# Patient Record
Sex: Male | Born: 1955 | Race: Black or African American | Hispanic: No | Marital: Single | Smoking: Current every day smoker
Health system: Southern US, Community
[De-identification: ages and names within clinical notes are randomized; demographics above are authoritative.]

## PROBLEM LIST (undated history)

## (undated) DIAGNOSIS — G250 Essential tremor: Secondary | ICD-10-CM

## (undated) DIAGNOSIS — J449 Chronic obstructive pulmonary disease, unspecified: Secondary | ICD-10-CM

## (undated) DIAGNOSIS — J4489 Other specified chronic obstructive pulmonary disease: Secondary | ICD-10-CM

## (undated) DIAGNOSIS — I10 Essential (primary) hypertension: Secondary | ICD-10-CM

## (undated) DIAGNOSIS — E785 Hyperlipidemia, unspecified: Secondary | ICD-10-CM

## (undated) DIAGNOSIS — M109 Gout, unspecified: Secondary | ICD-10-CM

## (undated) DIAGNOSIS — M1990 Unspecified osteoarthritis, unspecified site: Secondary | ICD-10-CM

## (undated) DIAGNOSIS — R319 Hematuria, unspecified: Secondary | ICD-10-CM

## (undated) DIAGNOSIS — K7460 Unspecified cirrhosis of liver: Secondary | ICD-10-CM

## (undated) DIAGNOSIS — F1010 Alcohol abuse, uncomplicated: Secondary | ICD-10-CM

## (undated) HISTORY — DX: Unspecified osteoarthritis, unspecified site: M19.90

## (undated) HISTORY — DX: Gout, unspecified: M10.9

## (undated) HISTORY — PX: OTHER SURGICAL HISTORY: SHX169

## (undated) HISTORY — DX: Essential (primary) hypertension: I10

## (undated) HISTORY — DX: Hyperlipidemia, unspecified: E78.5

## (undated) HISTORY — PX: HERNIA REPAIR: SHX51

## (undated) HISTORY — DX: Hematuria, unspecified: R31.9

---

## 2007-01-30 ENCOUNTER — Emergency Department: Payer: Self-pay | Admitting: Unknown Physician Specialty

## 2007-08-02 ENCOUNTER — Ambulatory Visit: Payer: Self-pay | Admitting: Rheumatology

## 2009-04-09 ENCOUNTER — Emergency Department: Payer: Self-pay | Admitting: Emergency Medicine

## 2009-04-10 ENCOUNTER — Emergency Department: Payer: Self-pay | Admitting: Emergency Medicine

## 2011-08-12 ENCOUNTER — Other Ambulatory Visit: Payer: Self-pay | Admitting: Unknown Physician Specialty

## 2011-08-12 LAB — BODY FLUID CELL COUNT WITH DIFFERENTIAL
Basophil: 0 %
Eosinophil: 0 %
Lymphocytes: 3 %
Neutrophils: 86 %
Other Cells BF: 0 %

## 2011-08-12 LAB — SYNOVIAL FLUID, CRYSTAL

## 2012-04-09 ENCOUNTER — Emergency Department: Payer: Self-pay | Admitting: Emergency Medicine

## 2012-04-09 LAB — CBC
HCT: 40.5 % (ref 40.0–52.0)
MCH: 31.4 pg (ref 26.0–34.0)
MCHC: 34.6 g/dL (ref 32.0–36.0)
RDW: 14.4 % (ref 11.5–14.5)

## 2013-11-22 DIAGNOSIS — J45909 Unspecified asthma, uncomplicated: Secondary | ICD-10-CM | POA: Insufficient documentation

## 2013-11-22 DIAGNOSIS — E785 Hyperlipidemia, unspecified: Secondary | ICD-10-CM | POA: Insufficient documentation

## 2013-11-22 DIAGNOSIS — I10 Essential (primary) hypertension: Secondary | ICD-10-CM | POA: Insufficient documentation

## 2013-12-12 DIAGNOSIS — R29898 Other symptoms and signs involving the musculoskeletal system: Secondary | ICD-10-CM

## 2013-12-12 DIAGNOSIS — M259 Joint disorder, unspecified: Secondary | ICD-10-CM | POA: Insufficient documentation

## 2014-04-09 ENCOUNTER — Emergency Department: Payer: Self-pay | Admitting: Emergency Medicine

## 2014-04-15 DIAGNOSIS — S37009A Unspecified injury of unspecified kidney, initial encounter: Secondary | ICD-10-CM | POA: Insufficient documentation

## 2014-04-15 DIAGNOSIS — E876 Hypokalemia: Secondary | ICD-10-CM | POA: Insufficient documentation

## 2014-04-15 DIAGNOSIS — D696 Thrombocytopenia, unspecified: Secondary | ICD-10-CM | POA: Insufficient documentation

## 2014-04-15 DIAGNOSIS — M109 Gout, unspecified: Secondary | ICD-10-CM | POA: Insufficient documentation

## 2014-04-15 DIAGNOSIS — G939 Disorder of brain, unspecified: Secondary | ICD-10-CM | POA: Insufficient documentation

## 2014-04-15 DIAGNOSIS — G250 Essential tremor: Secondary | ICD-10-CM | POA: Insufficient documentation

## 2014-04-18 DIAGNOSIS — E512 Wernicke's encephalopathy: Secondary | ICD-10-CM | POA: Insufficient documentation

## 2014-04-18 DIAGNOSIS — F1010 Alcohol abuse, uncomplicated: Secondary | ICD-10-CM | POA: Insufficient documentation

## 2014-04-26 ENCOUNTER — Ambulatory Visit: Payer: Self-pay | Admitting: Nephrology

## 2014-04-26 DIAGNOSIS — R972 Elevated prostate specific antigen [PSA]: Secondary | ICD-10-CM | POA: Insufficient documentation

## 2015-08-26 ENCOUNTER — Ambulatory Visit: Payer: Self-pay | Admitting: Urology

## 2015-08-29 ENCOUNTER — Telehealth: Payer: Self-pay | Admitting: Urology

## 2015-08-29 ENCOUNTER — Encounter: Payer: Self-pay | Admitting: Urology

## 2015-08-29 ENCOUNTER — Ambulatory Visit (INDEPENDENT_AMBULATORY_CARE_PROVIDER_SITE_OTHER): Payer: BLUE CROSS/BLUE SHIELD | Admitting: Urology

## 2015-08-29 VITALS — BP 148/79 | HR 75 | Ht 73.0 in | Wt 211.0 lb

## 2015-08-29 DIAGNOSIS — K648 Other hemorrhoids: Secondary | ICD-10-CM

## 2015-08-29 DIAGNOSIS — Z87898 Personal history of other specified conditions: Secondary | ICD-10-CM | POA: Insufficient documentation

## 2015-08-29 DIAGNOSIS — N401 Enlarged prostate with lower urinary tract symptoms: Secondary | ICD-10-CM

## 2015-08-29 DIAGNOSIS — R310 Gross hematuria: Secondary | ICD-10-CM

## 2015-08-29 DIAGNOSIS — N138 Other obstructive and reflux uropathy: Secondary | ICD-10-CM | POA: Insufficient documentation

## 2015-08-29 DIAGNOSIS — N400 Enlarged prostate without lower urinary tract symptoms: Secondary | ICD-10-CM

## 2015-08-29 LAB — URINALYSIS, COMPLETE
Bilirubin, UA: POSITIVE — AB
Ketones, UA: NEGATIVE
Leukocytes, UA: NEGATIVE
NITRITE UA: NEGATIVE
RBC, UA: NEGATIVE
Specific Gravity, UA: 1.01 (ref 1.005–1.030)
Urobilinogen, Ur: >8 mg/dL — ABNORMAL HIGH (ref 0.2–1.0)
pH, UA: 6 (ref 5.0–7.5)

## 2015-08-29 LAB — MICROSCOPIC EXAMINATION: BACTERIA UA: NONE SEEN

## 2015-08-29 NOTE — Progress Notes (Addendum)
08/29/2015 10:51 AM   Raymond Erickson [birth year] [id number]  Referring provider: No referring provider defined for this encounter.  Chief Complaint  Patient presents with  . Hematuria     referred by Raymond Erickson    HPI: Patient is a 60 year old African American male who is referred by Raymond Erickson for two episodes of painless gross hematuria.    Gross hematuria Patient had two episodes of painless gross hematuria.  He has not experienced dysuria, flank pain or suprapubic pain.  He has spontaneously passed a stone in the distant past.  He does not have a personal history of GU malignancies. He does not have a family history of GU malignancies.  History of elevated PSA At one point, patient was having PSAs drawn at his place of employment.  He states his PSA was found to be elevated and was seen by Raymond Erickson for further evaluation.  He feels that he may have had a prostate biopsy which he believes was negative. He was given a medication to shrink the prostate and has not followed up. This was approximately 3 years ago.  I will request those records.  BPH with LUTS Patient was told he had a "big prostate" and was given finasteride for his prostate by Dr. Evelene Erickson.  He does not have any urinary complaints at this time.     PMH: Past Medical History  Diagnosis Date  . Arthritis   . Hematuria   . Gout   . HLD (hyperlipidemia)   . HTN (hypertension)     Surgical History: Past Surgical History  Procedure Laterality Date  . Hernia repair    . Broken nose      Home Medications:    Medication List       This list is accurate as of: 08/29/15 10:51 AM.  Always use your most recent med list.               allopurinol 100 MG tablet  Commonly known as:  ZYLOPRIM  Take by mouth.     amLODipine 10 MG tablet  Commonly known as:  NORVASC  Take by mouth. Reported on 08/29/2015     chlorthalidone 25 MG tablet  Commonly known as:  HYGROTON  take 1 tablet by mouth once  daily if needed     clobetasol ointment 0.05 %  Commonly known as:  TEMOVATE     clonazePAM 0.5 MG tablet  Commonly known as:  KLONOPIN     cloNIDine 0.1 MG tablet  Commonly known as:  CATAPRES  Take by mouth.     colchicine 0.6 MG tablet  Take by mouth.     COMBIVENT RESPIMAT 20-100 MCG/ACT Aers respimat  Generic drug:  Ipratropium-Albuterol  inhale 2 puffs by mouth three times a day if needed     cyclobenzaprine 5 MG tablet  Commonly known as:  FLEXERIL  Take 5 mg by mouth. Reported on 08/29/2015     hydrALAZINE 50 MG tablet  Commonly known as:  APRESOLINE  Take by mouth.     hydrOXYzine 25 MG tablet  Commonly known as:  ATARAX/VISTARIL  Take by mouth. Reported on 08/29/2015     propranolol 40 MG tablet  Commonly known as:  INDERAL     triamcinolone lotion 0.1 %  Commonly known as:  KENALOG     Vitamin D (Ergocalciferol) 50000 units Caps capsule  Commonly known as:  DRISDOL        Allergies:  Allergies  Allergen Reactions  . Ace Inhibitors Swelling  . Lisinopril Swelling    Family History: Family History  Problem Relation Age of Onset  . Kidney disease Neg Hx   . Prostate cancer Neg Hx     Social History:  reports that he has been smoking.  He does not have any smokeless tobacco history on file. He reports that he drinks alcohol. He reports that he does not use illicit drugs.  ROS: UROLOGY Frequent Urination?: No Hard to postpone urination?: No Burning/pain with urination?: No Get up at night to urinate?: No Leakage of urine?: No Urine stream starts and stops?: No Trouble starting stream?: No Do you have to strain to urinate?: No Blood in urine?: Yes Urinary tract infection?: No Sexually transmitted disease?: No Injury to kidneys or bladder?: No Painful intercourse?: No Weak stream?: No Erection problems?: No Penile pain?: No  Gastrointestinal Nausea?: Yes Vomiting?: Yes Indigestion/heartburn?: No Diarrhea?: Yes Constipation?:  Yes  Constitutional Fever: No Night sweats?: No Weight loss?: No Fatigue?: No  Skin Skin rash/lesions?: Yes Itching?: Yes  Eyes Blurred vision?: No Double vision?: No  Ears/Nose/Throat Sore throat?: No Sinus problems?: Yes  Hematologic/Lymphatic Swollen glands?: No Easy bruising?: No  Cardiovascular Leg swelling?: Yes Chest pain?: No  Respiratory Cough?: Yes Shortness of breath?: No  Endocrine Excessive thirst?: No  Musculoskeletal Back pain?: No Joint pain?: Yes  Neurological Headaches?: No Dizziness?: No  Psychologic Depression?: No Anxiety?: No  Physical Exam: BP 148/79 mmHg  Pulse 75  Ht  (1.854 m)  Wt 211 lb (95.709 kg)  BMI 27.84 kg/m2  Constitutional: Well nourished. Alert and oriented, No acute distress. HEENT: [place] AT, moist mucus membranes. Trachea midline, no masses. Cardiovascular: No clubbing, cyanosis, or edema. Respiratory: Normal respiratory effort, no increased work of breathing. GI: Abdomen is soft, non tender, non distended, no abdominal masses. Liver and spleen not palpable.  No hernias appreciated.  Stool sample for occult testing is not indicated.   GU: No CVA tenderness.  No bladder fullness or masses.  Patient with uncircumcised phallus. Foreskin easily retracted  Urethral meatus is patent.  No penile discharge. No penile lesions or rashes. Scrotum without lesions, cysts, rashes and/or edema.  Testicles are located scrotally bilaterally. No masses are appreciated in the testicles. Left and right epididymis are normal. Rectal: Patient with  normal sphincter tone. Anus and perineum without scarring or rashes. External hemrrhoids.  Blood on glove.  No rectal masses are appreciated. Prostate is approximately 50 grams, no nodules are appreciated. Seminal vesicles are normal. Skin: No rashes, bruises or suspicious lesions. Lymph: No cervical or inguinal adenopathy. Neurologic: Grossly intact, no focal deficits, moving all 4  extremities. Psychiatric: Normal mood and affect.  Laboratory Data:  Lab Results  Component Value Date   WBC 5.1 04/09/2012   HGB 14.0 04/09/2012   HCT 40.5 04/09/2012   MCV 91 04/09/2012   PLT 188 04/09/2012    Urinalysis Results for orders placed or performed in visit on 08/29/15  Microscopic Examination  Result Value Ref Range   WBC, UA 0-5 0 -  5 /hpf   RBC, UA 0-2 0 -  2 /hpf   Epithelial Cells (non renal) 0-10 0 - 10 /hpf   Bacteria, UA None seen None seen/Few  Urinalysis, Complete  Result Value Ref Range   Specific Gravity, UA 1.010 1.005 - 1.030   pH, UA 6.0 5.0 - 7.5   Color, UA Yellow Yellow   Appearance Ur Clear Clear  Leukocytes, UA Negative Negative   Protein, UA 1+ (A) Negative/Trace   Glucose, UA Trace (A) Negative   Ketones, UA Negative Negative   RBC, UA Negative Negative   Bilirubin, UA Positive (A) Negative   Urobilinogen, Ur >8.0 (H) 0.2 - 1.0 mg/dL   Nitrite, UA Negative Negative   Microscopic Examination See below:    Assessment & Plan:    1. Gross hematuria:    Explained to patient the causes of blood in the urine are as follows: stones,  BPH, UTI's, damage to the urinary tract and/or cancer.  It is explained to the patient that they will be scheduled for a CT Urogram with contrast material and that in rare instances, an allergic reaction can be serious and even life threatening with the injection of contrast material.   The patient denies any allergies to contrast, iodine and/or seafood and is not taking metformin.  I have explained to the patient that they will  be scheduled for a cystoscopy in our office to evaluate their bladder.  The cystoscopy consists of passing a tube with a lens up through their urethra and into their urinary bladder.   We will inject the urethra with a lidocaine gel prior to introducing the cystoscope to help with any discomfort during the procedure.   After the procedure, they might experience blood in the urine and  discomfort with urination.  This will abate after the first few voids.  I have  encouraged the patient to increase water intake  during this time.  Patient denies any allergies to lidocaine.   - Urinalysis, Complete - CULTURE, URINE COMPREHENSIVE - BUN+Creat  2. History of elevated PSA:   Patient reports a history of elevated PSA's and a previous biopsy with Dr. Evelene Croon.  I will obtain a PSA today and request his records from Dr. Amada Jupiter office.    3. BPH without obstruction/lower urinary tract symptoms:    Patient has been on finasteride in the past.  His prostate is enlarged on exam.  We will continue to monitor as the prostate as it will continue to grow and may cause worsening of LUTS.  If no GU pathology is discovered during his hematuria work up and his PSA is not elevated, we will follow the patient with an IPSS score, exam and PSA on a yearly basis.  - PSA  4. Bleeding hemorrhoids:   Patient has not had a screening colonoscopy.  I recommended to the patient to discuss this with his PCP.     Return for CT Urogram report and cystoscopy.  These notes generated with voice recognition software. I apologize for typographical errors.  Cloretta Ned  [facility] [address], [address] [place], [place] [address] [phone]  Addendum:   PSA history obtained from Dr. Terrance Mass office    3.1 in 2008    3.9 in 2009    3.3 in 2010    5.6 in 2011    Biopsy negative in 2012 with iPSA 5.6

## 2015-08-29 NOTE — Telephone Encounter (Signed)
Would you send a copy of my note today to Dr. Thedore MinsSingh?

## 2015-08-30 LAB — BUN+CREAT
BUN/Creatinine Ratio: 9 (ref 9–20)
BUN: 5 mg/dL — ABNORMAL LOW (ref 6–24)
CREATININE: 0.55 mg/dL — AB (ref 0.76–1.27)
GFR calc Af Amer: 134 mL/min/{1.73_m2} (ref >59)
GFR calc non Af Amer: 116 mL/min/{1.73_m2} (ref >59)

## 2015-08-30 LAB — PSA: PROSTATE SPECIFIC AG, SERUM: 6.7 ng/mL — AB (ref 0.0–4.0)

## 2015-08-31 LAB — CULTURE, URINE COMPREHENSIVE

## 2015-09-02 NOTE — Telephone Encounter (Signed)
Done ° ° °Raymond Erickson °

## 2015-09-12 ENCOUNTER — Ambulatory Visit
Admission: RE | Admit: 2015-09-12 | Discharge: 2015-09-12 | Disposition: A | Discharge status: Home / Self Care | Payer: BLUE CROSS/BLUE SHIELD | Source: Ambulatory Visit | Attending: Urology | Admitting: Urology

## 2015-09-12 DIAGNOSIS — R310 Gross hematuria: Secondary | ICD-10-CM | POA: Diagnosis not present

## 2015-09-12 DIAGNOSIS — K8020 Calculus of gallbladder without cholecystitis without obstruction: Secondary | ICD-10-CM | POA: Insufficient documentation

## 2015-09-12 DIAGNOSIS — I700 Atherosclerosis of aorta: Secondary | ICD-10-CM | POA: Diagnosis not present

## 2015-09-12 MED ORDER — IOPAMIDOL (ISOVUE-370) INJECTION 76%
125.0000 mL | Freq: Once | INTRAVENOUS | Status: AC | PRN
  Administered 2015-09-12: 125 mL via INTRAVENOUS

## 2015-09-16 ENCOUNTER — Telehealth: Payer: Self-pay

## 2015-09-16 DIAGNOSIS — R972 Elevated prostate specific antigen [PSA]: Secondary | ICD-10-CM

## 2015-09-16 NOTE — Telephone Encounter (Signed)
-----   Message from Harle BattiestShannon A McGowan, PA-C sent at 09/12/2015  1:09 PM EDT ----- Patient had a biopsy in 2012 with Dr. Sheppard PentonWolf when his PSA was 5.6. It was benign at that time.  Patient will be coming in for a CT urogram and cystoscopy, we can repeat the PSA at that time.

## 2015-09-16 NOTE — Telephone Encounter (Signed)
Lab orders placed.  

## 2015-09-17 ENCOUNTER — Ambulatory Visit (INDEPENDENT_AMBULATORY_CARE_PROVIDER_SITE_OTHER): Payer: BLUE CROSS/BLUE SHIELD | Admitting: Urology

## 2015-09-17 VITALS — BP 136/74 | HR 111 | Ht 71.0 in | Wt 215.0 lb

## 2015-09-17 DIAGNOSIS — R972 Elevated prostate specific antigen [PSA]: Secondary | ICD-10-CM

## 2015-09-17 DIAGNOSIS — K7460 Unspecified cirrhosis of liver: Secondary | ICD-10-CM

## 2015-09-17 DIAGNOSIS — R188 Other ascites: Secondary | ICD-10-CM

## 2015-09-17 DIAGNOSIS — R310 Gross hematuria: Secondary | ICD-10-CM | POA: Diagnosis not present

## 2015-09-17 MED ORDER — CIPROFLOXACIN HCL 500 MG PO TABS
500.0000 mg | ORAL_TABLET | Freq: Once | ORAL | Status: AC
Start: 2015-09-17 — End: 2015-09-17
  Administered 2015-09-17: 500 mg via ORAL

## 2015-09-17 MED ORDER — LIDOCAINE HCL 2 % EX GEL
1.0000 "application " | Freq: Once | CUTANEOUS | Status: AC
  Administered 2015-09-17: 1 via URETHRAL

## 2015-09-17 NOTE — Progress Notes (Signed)
09/17/2015   HPI: 60 yo smoker with painless gross hematuria x 2.  He presents for office cystoscopy today to complete his workup. CT urogram shows no GU pathology which was reviewed today with the patient. He does have incidental perihepatic fluid and UA today does show 3+ bilirubin.  He also has a history of elevated PSA, most recent PSA 6.7 on 08/29/2015. He is status post prostate biopsy in 2012 at which time his PSA was 5.6 by Dr. Artis FlockWolfe.  Exam Blood pressure 136/74, pulse 111, height 5\' 11"  (1.803 m), weight 215 lb (97.523 kg). NAD.  A&O Abd soft, ND, NT Normal circumcised phallus with orthotopic meatus. Urethra patent. Scrotum unremarkable.     Study Result     CLINICAL DATA: Two episodes of painless gross hematuria in last 3 weeks. He has not experienced dysuria, flank pain or suprapubic pain. He states he has been experiencing frequent urination. Hx Kidney stones which he states he passed 5 years ago.  EXAM: CT ABDOMEN AND PELVIS WITHOUT AND WITH CONTRAST  TECHNIQUE: Multidetector CT imaging of the abdomen and pelvis was performed following the standard protocol before and following the bolus administration of intravenous contrast.  CONTRAST: 125 mL Isovue  COMPARISON: CT 01/31/2007  FINDINGS: Lower chest: Lung bases are clear.  Hepatobiliary: The liver is enlarged with prominence of the caudate lobe. No enhancing hepatic lesion. There is sludge and gallstones within the lumen of the gallbladder. Small amount of pericholecystic fluid. Mild haziness within the porta hepatis and small amount of fluid along the RIGHT hepatic lobe and pericolic gutter.  Pancreas: Pancreas is normal. No ductal dilatation. No pancreatic inflammation.  Spleen: Normal spleen  Adrenals/urinary tract: Adrenal glands are normal. No nephrolithiasis or ureterolithiasis. Cortical phase imaging demonstrates no enhancing renal cortical lesion. Delayed pyelogram phase imaging  demonstrates no filling defects within the collecting systems or ureters.  No bladder calculi. No filling defect within the bladder.  Stomach/Bowel: Large hiatal hernia. Duodenum small bowel appendix are normal. The colon rectosigmoid colon are normal.  Vascular/Lymphatic: Abdominal aorta is normal caliber with atherosclerotic calcification. There is no retroperitoneal or periportal lymphadenopathy. No pelvic lymphadenopathy.  Reproductive: Post hysterectomy.  Other: No free fluid.  Musculoskeletal: No aggressive osseous lesion.  IMPRESSION: 1. No explanation for hematuria. No nephrolithiasis, ureterolithiasis, enhancing renal cortical lesion, or filling defects within the collecting systems. 2. No bladder stones or filling defects in the bladder which does not excluded a bladder lesion. 3. Enlarged liver with perihepatic fluid suggests cirrhosis and mild ascites. 4. Cholelithiasis without clear evidence cholecystitis. 5. Atherosclerotic calcification of the abdominal aorta.   Electronically Signed  By: Genevive BiStewart Edmunds M.D.  On: 09/12/2015 10:56     Cystoscopy Procedure Note  Patient identification was confirmed, informed consent was obtained, and patient was prepped using Betadine solution.  Lidocaine jelly was administered per urethral meatus.    Preoperative abx where received prior to procedure.     Pre-Procedure: - Inspection reveals a normal caliber ureteral meatus.  Procedure: The flexible cystoscope was introduced without difficulty - No urethral strictures/lesions are present. - Normal prostate, 3 cm with minimal coapation - Normal bladder neck - Bilateral ureteral orifices identified - Bladder mucosa  reveals no ulcers, tumors, or lesions - No bladder stones - No trabeculation  Retroflexion fairly unremarkable.    Post-Procedure: - Patient tolerated the procedure well  Assessment/Plan:  1. Gross hematuria Negative CT urogram and  office cystoscopy today. No clear etiology of gross hematuria. The importance of smoking  cessation and its relationship to bladder cancer. I would recommend repeat evaluation in 2 years if he continues to smoke including renal ultrasound and office cystoscopy if he continues to have microscopic hematuria.   - Urinalysis, Complete - Comprehensive metabolic panel - ciprofloxacin (CIPRO) tablet 500 mg; Take 1 tablet (500 mg total) by mouth once. - lidocaine (XYLOCAINE) 2 % jelly 1 application; Place 1 application into the urethra once. - PSA  2. Elevated PSA History of elevated PSA status post negative biopsy in 2012. PSA repeated today, although following cystoscopy so may not be accurate.  3. Cirrhosis of liver with ascites, unspecified hepatic cirrhosis type (HCC) Perihepatic fat on CT scan with some mild ascites. 2+ bilirubin today on UA. As such, we'll check CMP. Will arrange appropriate referral as needed.

## 2015-09-18 LAB — MICROSCOPIC EXAMINATION: BACTERIA UA: NONE SEEN

## 2015-09-18 LAB — COMPREHENSIVE METABOLIC PANEL
A/G RATIO: 0.6 — AB (ref 1.2–2.2)
ALBUMIN: 3 g/dL — AB (ref 3.5–5.5)
ALK PHOS: 257 IU/L — AB (ref 39–117)
ALT: 21 IU/L (ref 0–44)
AST: 169 IU/L — ABNORMAL HIGH (ref 0–40)
BUN / CREAT RATIO: 5 — AB (ref 9–20)
BUN: 3 mg/dL — AB (ref 6–24)
Bilirubin Total: 4.1 mg/dL — ABNORMAL HIGH (ref 0.0–1.2)
CO2: 26 mmol/L (ref 18–29)
CREATININE: 0.64 mg/dL — AB (ref 0.76–1.27)
Calcium: 8.2 mg/dL — ABNORMAL LOW (ref 8.7–10.2)
Chloride: 90 mmol/L — ABNORMAL LOW (ref 96–106)
GFR, EST AFRICAN AMERICAN: 126 mL/min/{1.73_m2} (ref >59)
GFR, EST NON AFRICAN AMERICAN: 109 mL/min/{1.73_m2} (ref >59)
GLOBULIN, TOTAL: 4.7 g/dL — AB (ref 1.5–4.5)
GLUCOSE: 124 mg/dL — AB (ref 65–99)
Potassium: 2.7 mmol/L — ABNORMAL LOW (ref 3.5–5.2)
Sodium: 138 mmol/L (ref 134–144)
TOTAL PROTEIN: 7.7 g/dL (ref 6.0–8.5)

## 2015-09-18 LAB — PSA: PROSTATE SPECIFIC AG, SERUM: 6.5 ng/mL — AB (ref 0.0–4.0)

## 2015-09-18 LAB — URINALYSIS, COMPLETE
Bilirubin, UA: POSITIVE — AB
LEUKOCYTES UA: NEGATIVE
Nitrite, UA: NEGATIVE
RBC, UA: NEGATIVE
Specific Gravity, UA: 1.02 (ref 1.005–1.030)
pH, UA: 6 (ref 5.0–7.5)

## 2015-09-19 ENCOUNTER — Telehealth: Payer: Self-pay

## 2015-09-19 NOTE — Telephone Encounter (Signed)
-----   Message from Vanna ScotlandAshley Brandon, MD sent at 09/18/2015  6:05 PM EDT ----- Called patient to discuss lab results. Patient continues to drink alcohol on a regular basis and has evidence of liver dysfunction. I've message to his primary care physician as well regarding these labs and advised the patient to call today to make a follow-up with them to discuss.  Patient was also made aware of his elevated PSA. In light other medical issues at this time, I would like him to return in 3 months for repeat PSA/DRE. If stable, may continue to follow given his history of previous negative biopsies.  Please arrange f/u in 3 months for PSA/ DRE with [facility] HerterShannon.

## 2015-09-23 NOTE — Telephone Encounter (Signed)
LMOM. Need to arrange f/[facility] in 3 months [facility] Dr Apolinar JunesBrandon.

## 2015-09-23 NOTE — Telephone Encounter (Signed)
appts made per Dr Delana MeyerBrandon's note

## 2015-11-04 ENCOUNTER — Emergency Department: Payer: BLUE CROSS/BLUE SHIELD

## 2015-11-04 ENCOUNTER — Emergency Department
Admission: EM | Admit: 2015-11-04 | Discharge: 2015-11-05 | Disposition: A | Discharge status: Other Acute Inpatient Hospital | Payer: BLUE CROSS/BLUE SHIELD | Attending: Emergency Medicine | Admitting: Emergency Medicine

## 2015-11-04 ENCOUNTER — Encounter: Payer: Self-pay | Admitting: Emergency Medicine

## 2015-11-04 DIAGNOSIS — D649 Anemia, unspecified: Secondary | ICD-10-CM | POA: Diagnosis not present

## 2015-11-04 DIAGNOSIS — M1990 Unspecified osteoarthritis, unspecified site: Secondary | ICD-10-CM | POA: Insufficient documentation

## 2015-11-04 DIAGNOSIS — Z79899 Other long term (current) drug therapy: Secondary | ICD-10-CM | POA: Insufficient documentation

## 2015-11-04 DIAGNOSIS — I10 Essential (primary) hypertension: Secondary | ICD-10-CM | POA: Insufficient documentation

## 2015-11-04 DIAGNOSIS — J449 Chronic obstructive pulmonary disease, unspecified: Secondary | ICD-10-CM | POA: Diagnosis not present

## 2015-11-04 DIAGNOSIS — I959 Hypotension, unspecified: Secondary | ICD-10-CM | POA: Insufficient documentation

## 2015-11-04 DIAGNOSIS — F1010 Alcohol abuse, uncomplicated: Secondary | ICD-10-CM

## 2015-11-04 DIAGNOSIS — F1019 Alcohol abuse with unspecified alcohol-induced disorder: Secondary | ICD-10-CM | POA: Diagnosis not present

## 2015-11-04 DIAGNOSIS — E785 Hyperlipidemia, unspecified: Secondary | ICD-10-CM | POA: Insufficient documentation

## 2015-11-04 DIAGNOSIS — F17200 Nicotine dependence, unspecified, uncomplicated: Secondary | ICD-10-CM | POA: Diagnosis not present

## 2015-11-04 DIAGNOSIS — J45909 Unspecified asthma, uncomplicated: Secondary | ICD-10-CM | POA: Insufficient documentation

## 2015-11-04 DIAGNOSIS — R58 Hemorrhage, not elsewhere classified: Secondary | ICD-10-CM | POA: Insufficient documentation

## 2015-11-04 DIAGNOSIS — R1030 Lower abdominal pain, unspecified: Secondary | ICD-10-CM | POA: Diagnosis present

## 2015-11-04 MED ORDER — ONDANSETRON HCL 4 MG/2ML IJ SOLN
4.0000 mg | Freq: Once | INTRAMUSCULAR | Status: AC
  Administered 2015-11-05: 4 mg via INTRAVENOUS
  Filled 2015-11-04: qty 2

## 2015-11-04 MED ORDER — MORPHINE SULFATE (PF) 2 MG/ML IV SOLN
2.0000 mg | Freq: Once | INTRAVENOUS | Status: AC
  Administered 2015-11-05: 2 mg via INTRAVENOUS
  Filled 2015-11-04: qty 1

## 2015-11-04 MED ORDER — DIATRIZOATE MEGLUMINE & SODIUM 66-10 % PO SOLN
15.0000 mL | Freq: Once | ORAL | Status: AC
  Administered 2015-11-05: 15 mL via ORAL

## 2015-11-04 MED ORDER — PANTOPRAZOLE SODIUM 40 MG IV SOLR
40.0000 mg | Freq: Once | INTRAVENOUS | Status: AC
  Administered 2015-11-05: 40 mg via INTRAVENOUS
  Filled 2015-11-04: qty 40

## 2015-11-04 MED ORDER — LORAZEPAM 2 MG/ML IJ SOLN
0.0000 mg | Freq: Four times a day (QID) | INTRAMUSCULAR | Status: DC
Start: 2015-11-05 — End: 2015-11-05
  Administered 2015-11-05: 2 mg via INTRAVENOUS
  Filled 2015-11-04: qty 2

## 2015-11-04 MED ORDER — SODIUM CHLORIDE 0.9 % IV BOLUS (SEPSIS)
1000.0000 mL | Freq: Once | INTRAVENOUS | Status: AC
  Administered 2015-11-05: 1000 mL via INTRAVENOUS

## 2015-11-04 MED ORDER — VITAMIN B-1 100 MG PO TABS: 100.0000 mg | ORAL_TABLET | Freq: Every day | ORAL | Status: DC

## 2015-11-04 NOTE — ED Notes (Signed)
Pt arrived by EMS from home with c/o lower abdominal pain. EMS reports pt has had pain and weakness for 12 hours, hx of alcohol abuse. Pt denies bloody stool or emesis. Pt is jaundiced upon arrival, states he knows something is wrong with his liver but does not know what. Pts hypotensive upon arrival, 95/66.

## 2015-11-04 NOTE — ED Provider Notes (Signed)
[facility] Emergency Department Provider Note   ____________________________________________  Time seen: Approximately 11:43 PM  I have reviewed the triage vital signs and the nursing notes.   HISTORY  Chief Complaint Abdominal Pain    HPI Raymond Erickson is a 60 y.o. male who presents to the ED from home via EMS with chief complaint of lower abdominal pain. Patient reports onset of midline low abdominal pain to epigastrium approximately 12 hours ago. Describes pain as "gas pain". Symptoms associated with nausea. Denies associated fever, chills, chest pain, shortness of breath, vomiting, diarrhea,  bloody stools or hematemesis.Patient does admit to heavy but not daily drinking. States last drink yesterday. No history of DTs. Patient arrives to the ED tachycardic and hypotensive. Denies recent travel or trauma. Nothing makes his symptoms better or worse.   Past Medical History  Diagnosis Date  . Arthritis   . Hematuria   . Gout   . HLD (hyperlipidemia)   . HTN (hypertension)     Patient Active Problem List   Diagnosis Date Noted  . Gross hematuria 08/29/2015  . 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 History of elevated PSA 08/29/2015  . Other hemorrhoids 08/29/2015  . Elevated prostate specific antigen (PSA) 04/26/2014  . AA (alcohol abuse) 04/18/2014  . 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 Injury of kidney 04/15/2014  . Brain disorder 04/15/2014  . 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 Gout 04/15/2014  . Decreased potassium in the blood 04/15/2014  . Hypomagnesemia 04/15/2014  . Thrombocytopenia (HCC) 04/15/2014  . Disorder of hip region 12/12/2013  . Airway hyperreactivity 11/22/2013  . BP (high blood pressure) 11/22/2013  . HLD (hyperlipidemia) 11/22/2013    Past Surgical History  Procedure Laterality Date  . Hernia repair    . Broken nose      Current Outpatient Rx  Name  Route  Sig  Dispense   Refill  . allopurinol (ZYLOPRIM) 100 MG tablet   Oral   Take 100 mg by mouth 2 (two) times daily.          . chlorthalidone (HYGROTON) 25 MG tablet      take 1 tablet by mouth once daily if needed      1   . cholecalciferol (VITAMIN D) 1000 units tablet   Oral   Take 2,000 Units by mouth daily.         . clonazePAM (KLONOPIN) 0.5 MG tablet   Oral   Take 0.5 mg by mouth 2 (two) times daily as needed.          . cloNIDine (CATAPRES) 0.1 MG tablet   Oral   Take 0.1 mg by mouth 3 (three) times daily.          . COMBIVENT RESPIMAT 20-100 MCG/ACT AERS respimat      inhale 2 puffs by mouth three times a day if needed      0     Dispense as written.   . hydrALAZINE (APRESOLINE) 50 MG tablet   Oral   Take 50 mg by mouth 3 (three) times daily.          . potassium chloride SA (K-DUR,KLOR-CON) 20 MEQ tablet   Oral   Take 20 mEq by mouth daily.         . propranolol (INDERAL) 40 MG tablet   Oral   Take 40 mg by mouth 2 (two) times daily.          . Vitamin D, Ergocalciferol, (  DRISDOL) 50000 units CAPS capsule                 Allergies Ace inhibitors and Lisinopril  Family History  Problem Relation Age of Onset  . Kidney disease Neg Hx   . Prostate cancer Neg Hx     Social History Social History  Substance Use Topics  . Smoking status: Current Every Day Smoker  . Smokeless tobacco: None  . Alcohol Use: 0.0 oz/week    0 Standard drinks or equivalent per week    Review of Systems  Constitutional: No fever/chills. Eyes: No visual changes. ENT: No sore throat. Cardiovascular: Denies chest pain. Respiratory: Denies shortness of breath. Gastrointestinal: Positive for abdominal pain.  Positive for nausea, no vomiting.  No diarrhea.  No constipation. Genitourinary: Negative for dysuria. Musculoskeletal: Negative for back pain. Skin: Negative for rash. Neurological: Negative for headaches, focal weakness or numbness.  10-point ROS otherwise  negative.  ____________________________________________   PHYSICAL EXAM:  VITAL SIGNS: ED Triage Vitals  Enc Vitals Group     BP 11/04/15 2339 95/66 mmHg     Pulse Rate 11/04/15 2339 118     Resp 11/04/15 2339 17     Temp 11/04/15 2339 97.4 F (36.3 C)     Temp Source 11/04/15 2339 Oral     SpO2 11/04/15 2335 99 %     Weight 11/04/15 2339 205 lb (92.987 kg)     Height 11/04/15 2339 6\' 1"  (1.854 m)     Head Cir --      Peak Flow --      Pain Score --      Pain Loc --      Pain Edu? --      Excl. in GC? --     Constitutional: Alert and oriented. Well appearing and in mild acute distress. Eyes: Scleral icterus. PERRL. EOMI. Head: Atraumatic. Nose: No congestion/rhinnorhea. Mouth/Throat: Mucous membranes are moist.  Oropharynx non-erythematous. Neck: No stridor.   Cardiovascular: Tachycardic rate, regular rhythm. Grossly normal heart sounds.  Good peripheral circulation. Respiratory: Normal respiratory effort.  No retractions. Lungs CTAB. Gastrointestinal: Soft and mildly tender to palpation midline epigastrium, umbilicus and suprapubic regions without rebound or guarding. No distention. No abdominal bruits. No CVA tenderness. Musculoskeletal: No lower extremity tenderness nor edema.  No joint effusions. Neurologic:  Normal speech and language. No gross focal neurologic deficits are appreciated.  Skin:  Skin is warm, dry and intact. No rash noted. Psychiatric: Mood and affect are normal. Speech and behavior are normal.  ____________________________________________   LABS (all labs ordered are listed, but only abnormal results are displayed)  Labs Reviewed  CBC WITH DIFFERENTIAL/PLATELET - Abnormal; Notable for the following:    WBC 14.7 (*)    RBC 2.01 (*)    Hemoglobin 6.4 (*)    HCT 19.4 (*)    RDW 14.9 (*)    Platelets 148 (*)    Neutro Abs 12.1 (*)    Monocytes Absolute 1.1 (*)    All other components within normal limits  COMPREHENSIVE METABOLIC PANEL -  Abnormal; Notable for the following:    Potassium 3.0 (*)    CO2 15 (*)    Glucose, Bld 137 (*)    Calcium 7.1 (*)    Total Protein 6.0 (*)    Albumin 1.9 (*)    AST 110 (*)    ALT 16 (*)    Alkaline Phosphatase 176 (*)    Total Bilirubin 3.4 (*)    All  other components within normal limits  ETHANOL - Abnormal; Notable for the following:    Alcohol, Ethyl (B) 18 (*)    All other components within normal limits  LIPASE, BLOOD - Abnormal; Notable for the following:    Lipase 54 (*)    All other components within normal limits  PROTIME-INR - Abnormal; Notable for the following:    Prothrombin Time 16.7 (*)    All other components within normal limits  TROPONIN I  AMMONIA  TYPE AND SCREEN  ABO/RH  PREPARE RBC (CROSSMATCH)   ____________________________________________  EKG  ED ECG REPORT I, SUNG,JADE J, the attending physician, personally viewed and interpreted this ECG.   Date: 11/05/2015  EKG Time: 2342  Rate: 119  Rhythm: sinus tachycardia  Axis: Normal  Intervals:none  ST&T Change: Nonspecific  ____________________________________________  RADIOLOGY  CT abdomen/pelvis discussed with Dr. Manus GunningEhinger: 1. Large volume hemoperitoneum, most prominent in the right abdomen. Suspected source is a prominent collateral vessel coursing in the pericolic gutter, which appears to be is enteric in origin. Alternatively, i.e. bleeding hepatic lesion is considered, however felt less likely as no focal lesion was seen on CT performed last month. Patient does have underlying cirrhosis. 2. Additional chronic findings as described. Critical Value/emergent results were called by telephone at the time of interpretation on 11/05/2015 at 1:28 am to Dr. Chiquita LothJADE SUNG , who verbally acknowledged these results. ____________________________________________   PROCEDURES  Procedure(s) performed:   Rectal exam: External exam WNL. Tan stool on gloved finger which is slowly heme +.  Critical  Care performed:   CRITICAL CARE Performed by: Irean HongSUNG,JADE J   Total critical care time: 60 minutes  Critical care time was exclusive of separately billable procedures and treating other patients.  Critical care was necessary to treat or prevent imminent or life-threatening deterioration.  Critical care was time spent personally by me on the following activities: development of treatment plan with patient and/or surrogate as well as nursing, discussions with consultants, evaluation of patient's response to treatment, examination of patient, obtaining history from patient or surrogate, ordering and performing treatments and interventions, ordering and review of laboratory studies, ordering and review of radiographic studies, pulse oximetry and re-evaluation of patient's condition.  ____________________________________________   INITIAL IMPRESSION / ASSESSMENT AND PLAN / ED COURSE  Pertinent labs & imaging results that were available during my care of the patient were reviewed by me and considered in my medical decision making (see chart for details).  60 year old male with alcohol dependency with midline abdominal pain who arrives tachycardic and hypotensive. Repeat blood pressure increased to 95/66. Will obtain screening lab work, type and screen, CT abdomen/pelvis to evaluate for intra-abdominal/vascular etiologies for patient's pain. Continue IV fluid resuscitation. Will place on CIWA protocol.  ----------------------------------------- 1:32 AM on 11/05/2015 -----------------------------------------  Discussed CT scan with the radiologist. Vascular surgery paged.  ----------------------------------------- 1:44 AM on 11/05/2015 -----------------------------------------  Discussed with Dr. Myra GianottiBrabham (vascular surgery on call) who reviewed CT images. Recommends transfer to call for  interventional radiology for possible embolization which is not available overnight at this facility. In  the meantime I have ordered 4 units of PRBCs and will start transfusion once the blood is ready. I have updated the patient and his family members who are all agreeable with plan of care. [facility] contacted for transfer.  ----------------------------------------- 2:10 AM on 11/05/2015 -----------------------------------------  [facility] Blood is infusing; BP 111/74. Patient and family updated. [facility] to transport once patient receives bed assignment.  -----------------------------------------  4:01 AM on 11/05/2015 -----------------------------------------  [facility] at bedside for transport. BP 108/63, PRBCs infusing. ____________________________________________   FINAL CLINICAL IMPRESSION(S) / ED DIAGNOSES  Final diagnoses:  Intra abdominal hemorrhage  Hypotension, unspecified hypotension type  Anemia requiring transfusions  AA (alcohol abuse)      NEW MEDICATIONS STARTED DURING THIS VISIT:  New Prescriptions   No medications on file     Note:  This document was prepared using Dragon voice recognition software and may include unintentional dictation errors.    Irean Hong, MD 11/05/15 [id number]

## 2015-11-05 ENCOUNTER — Emergency Department: Payer: BLUE CROSS/BLUE SHIELD

## 2015-11-05 ENCOUNTER — Inpatient Hospital Stay (HOSPITAL_COMMUNITY)
Admission: AD | Admit: 2015-11-05 | Discharge: 2015-11-09 | DRG: 393 | Disposition: A | Discharge status: Home Health | Payer: BLUE CROSS/BLUE SHIELD | Source: Other Acute Inpatient Hospital | Attending: Internal Medicine | Admitting: Internal Medicine

## 2015-11-05 ENCOUNTER — Encounter (HOSPITAL_COMMUNITY): Payer: Self-pay | Admitting: Pulmonary Disease

## 2015-11-05 ENCOUNTER — Encounter: Payer: Self-pay | Admitting: Radiology

## 2015-11-05 DIAGNOSIS — R58 Hemorrhage, not elsewhere classified: Secondary | ICD-10-CM | POA: Diagnosis not present

## 2015-11-05 DIAGNOSIS — R000 Tachycardia, unspecified: Secondary | ICD-10-CM | POA: Diagnosis not present

## 2015-11-05 DIAGNOSIS — R319 Hematuria, unspecified: Secondary | ICD-10-CM | POA: Diagnosis not present

## 2015-11-05 DIAGNOSIS — K661 Hemoperitoneum: Secondary | ICD-10-CM | POA: Diagnosis present

## 2015-11-05 DIAGNOSIS — F10231 Alcohol dependence with withdrawal delirium: Secondary | ICD-10-CM | POA: Diagnosis present

## 2015-11-05 DIAGNOSIS — E785 Hyperlipidemia, unspecified: Secondary | ICD-10-CM | POA: Diagnosis not present

## 2015-11-05 DIAGNOSIS — I959 Hypotension, unspecified: Secondary | ICD-10-CM | POA: Diagnosis present

## 2015-11-05 DIAGNOSIS — N170 Acute kidney failure with tubular necrosis: Secondary | ICD-10-CM | POA: Diagnosis not present

## 2015-11-05 DIAGNOSIS — M1990 Unspecified osteoarthritis, unspecified site: Secondary | ICD-10-CM | POA: Diagnosis present

## 2015-11-05 DIAGNOSIS — I509 Heart failure, unspecified: Secondary | ICD-10-CM | POA: Diagnosis not present

## 2015-11-05 DIAGNOSIS — F1010 Alcohol abuse, uncomplicated: Secondary | ICD-10-CM | POA: Diagnosis present

## 2015-11-05 DIAGNOSIS — F17200 Nicotine dependence, unspecified, uncomplicated: Secondary | ICD-10-CM | POA: Diagnosis not present

## 2015-11-05 DIAGNOSIS — G250 Essential tremor: Secondary | ICD-10-CM | POA: Diagnosis not present

## 2015-11-05 DIAGNOSIS — K766 Portal hypertension: Secondary | ICD-10-CM | POA: Diagnosis present

## 2015-11-05 DIAGNOSIS — I10 Essential (primary) hypertension: Secondary | ICD-10-CM | POA: Diagnosis present

## 2015-11-05 DIAGNOSIS — I9589 Other hypotension: Secondary | ICD-10-CM | POA: Diagnosis not present

## 2015-11-05 DIAGNOSIS — F10931 Alcohol use, unspecified with withdrawal delirium: Secondary | ICD-10-CM | POA: Diagnosis present

## 2015-11-05 DIAGNOSIS — M109 Gout, unspecified: Secondary | ICD-10-CM | POA: Diagnosis present

## 2015-11-05 DIAGNOSIS — J449 Chronic obstructive pulmonary disease, unspecified: Secondary | ICD-10-CM | POA: Diagnosis present

## 2015-11-05 DIAGNOSIS — Z720 Tobacco use: Secondary | ICD-10-CM | POA: Diagnosis present

## 2015-11-05 DIAGNOSIS — R109 Unspecified abdominal pain: Secondary | ICD-10-CM | POA: Diagnosis present

## 2015-11-05 DIAGNOSIS — J438 Other emphysema: Secondary | ICD-10-CM | POA: Diagnosis not present

## 2015-11-05 DIAGNOSIS — K7031 Alcoholic cirrhosis of liver with ascites: Secondary | ICD-10-CM | POA: Diagnosis present

## 2015-11-05 DIAGNOSIS — R972 Elevated prostate specific antigen [PSA]: Secondary | ICD-10-CM | POA: Diagnosis present

## 2015-11-05 DIAGNOSIS — N179 Acute kidney failure, unspecified: Secondary | ICD-10-CM | POA: Diagnosis present

## 2015-11-05 DIAGNOSIS — K7030 Alcoholic cirrhosis of liver without ascites: Secondary | ICD-10-CM | POA: Diagnosis present

## 2015-11-05 HISTORY — DX: Essential tremor: G25.0

## 2015-11-05 HISTORY — DX: Chronic obstructive pulmonary disease, unspecified: J44.9

## 2015-11-05 HISTORY — DX: Alcohol abuse, uncomplicated: F10.10

## 2015-11-05 HISTORY — DX: Other specified chronic obstructive pulmonary disease: J44.89

## 2015-11-05 LAB — BASIC METABOLIC PANEL
Anion gap: 12 (ref 5–15)
Anion gap: 12 (ref 5–15)
BUN: 7 mg/dL (ref 6–20)
BUN: 9 mg/dL (ref 6–20)
CALCIUM: 7.8 mg/dL — AB (ref 8.9–10.3)
CHLORIDE: 102 mmol/L (ref 101–111)
CO2: 22 mmol/L (ref 22–32)
CO2: 21 mmol/L — ABNORMAL LOW (ref 22–32)
CREATININE: 1.36 mg/dL — AB (ref 0.61–1.24)
CREATININE: 1.27 mg/dL — AB (ref 0.61–1.24)
Calcium: 7.7 mg/dL — ABNORMAL LOW (ref 8.9–10.3)
Chloride: 102 mmol/L (ref 101–111)
GFR calc Af Amer: >60 mL/min (ref >60)
GFR calc Af Amer: >60 mL/min (ref >60)
GFR calc non Af Amer: 56 mL/min — ABNORMAL LOW (ref >60)
GLUCOSE: 142 mg/dL — AB (ref 65–99)
Glucose, Bld: 209 mg/dL — ABNORMAL HIGH (ref 65–99)
POTASSIUM: 3.8 mmol/L (ref 3.5–5.1)
POTASSIUM: 3.3 mmol/L — AB (ref 3.5–5.1)
SODIUM: 135 mmol/L (ref 135–145)
Sodium: 136 mmol/L (ref 135–145)

## 2015-11-05 LAB — COMPREHENSIVE METABOLIC PANEL
ALBUMIN: 1.9 g/dL — AB (ref 3.5–5.0)
ALT: 16 U/L — AB (ref 17–63)
ANION GAP: 15 (ref 5–15)
AST: 110 U/L — ABNORMAL HIGH (ref 15–41)
Alkaline Phosphatase: 176 U/L — ABNORMAL HIGH (ref 38–126)
BILIRUBIN TOTAL: 3.4 mg/dL — AB (ref 0.3–1.2)
BUN: 6 mg/dL (ref 6–20)
CO2: 15 mmol/L — AB (ref 22–32)
Calcium: 7.1 mg/dL — ABNORMAL LOW (ref 8.9–10.3)
Chloride: 107 mmol/L (ref 101–111)
Creatinine, Ser: 0.97 mg/dL (ref 0.61–1.24)
GFR calc non Af Amer: >60 mL/min (ref >60)
GLUCOSE: 137 mg/dL — AB (ref 65–99)
POTASSIUM: 3 mmol/L — AB (ref 3.5–5.1)
SODIUM: 137 mmol/L (ref 135–145)
TOTAL PROTEIN: 6 g/dL — AB (ref 6.5–8.1)

## 2015-11-05 LAB — MRSA PCR SCREENING: MRSA BY PCR: NEGATIVE

## 2015-11-05 LAB — CBC
HCT: 20.6 % — ABNORMAL LOW (ref 39.0–52.0)
HEMATOCRIT: 21.3 % — AB (ref 39.0–52.0)
Hemoglobin: 7.2 g/dL — ABNORMAL LOW (ref 13.0–17.0)
Hemoglobin: 7 g/dL — ABNORMAL LOW (ref 13.0–17.0)
MCH: 29.6 pg (ref 26.0–34.0)
MCH: 29.5 pg (ref 26.0–34.0)
MCHC: 33.8 g/dL (ref 30.0–36.0)
MCHC: 34 g/dL (ref 30.0–36.0)
MCV: 87.7 fL (ref 78.0–100.0)
MCV: 86.9 fL (ref 78.0–100.0)
PLATELETS: 167 10*3/uL (ref 150–400)
Platelets: 126 10*3/uL — ABNORMAL LOW (ref 150–400)
RBC: 2.43 MIL/uL — ABNORMAL LOW (ref 4.22–5.81)
RBC: 2.37 MIL/uL — AB (ref 4.22–5.81)
RDW: 18.1 % — AB (ref 11.5–15.5)
RDW: 18.2 % — AB (ref 11.5–15.5)
WBC: 15.5 10*3/uL — ABNORMAL HIGH (ref 4.0–10.5)
WBC: 17.2 10*3/uL — ABNORMAL HIGH (ref 4.0–10.5)

## 2015-11-05 LAB — CBC WITH DIFFERENTIAL/PLATELET
Basophils Absolute: 0.1 10*3/uL (ref 0–0.1)
EOS ABS: 0 10*3/uL (ref 0–0.7)
Eosinophils Relative: 0 %
HCT: 19.4 % — ABNORMAL LOW (ref 40.0–52.0)
HEMOGLOBIN: 6.4 g/dL — AB (ref 13.0–18.0)
LYMPHS ABS: 1.4 10*3/uL (ref 1.0–3.6)
Lymphocytes Relative: 10 %
MCH: 32 pg (ref 26.0–34.0)
MCHC: 33.2 g/dL (ref 32.0–36.0)
MCV: 96.6 fL (ref 80.0–100.0)
Monocytes Absolute: 1.1 10*3/uL — ABNORMAL HIGH (ref 0.2–1.0)
Monocytes Relative: 7 %
Neutro Abs: 12.1 10*3/uL — ABNORMAL HIGH (ref 1.4–6.5)
Platelets: 148 10*3/uL — ABNORMAL LOW (ref 150–440)
RBC: 2.01 MIL/uL — AB (ref 4.40–5.90)
RDW: 14.9 % — ABNORMAL HIGH (ref 11.5–14.5)
WBC: 14.7 10*3/uL — AB (ref 3.8–10.6)

## 2015-11-05 LAB — HEMOGLOBIN AND HEMATOCRIT, BLOOD
HCT: 18.8 % — ABNORMAL LOW (ref 39.0–52.0)
HEMATOCRIT: 23.3 % — AB (ref 39.0–52.0)
HEMATOCRIT: 22.6 % — AB (ref 39.0–52.0)
HEMOGLOBIN: 8 g/dL — AB (ref 13.0–17.0)
HEMOGLOBIN: 7.7 g/dL — AB (ref 13.0–17.0)
Hemoglobin: 6.2 g/dL — CL (ref 13.0–17.0)

## 2015-11-05 LAB — ETHANOL: Alcohol, Ethyl (B): 18 mg/dL — ABNORMAL HIGH (ref <5)

## 2015-11-05 LAB — PHOSPHORUS: Phosphorus: 5.2 mg/dL — ABNORMAL HIGH (ref 2.5–4.6)

## 2015-11-05 LAB — GLUCOSE, CAPILLARY: GLUCOSE-CAPILLARY: 132 mg/dL — AB (ref 65–99)

## 2015-11-05 LAB — LIPASE, BLOOD: LIPASE: 54 U/L — AB (ref 11–51)

## 2015-11-05 LAB — MAGNESIUM: Magnesium: 1.3 mg/dL — ABNORMAL LOW (ref 1.7–2.4)

## 2015-11-05 LAB — TROPONIN I: TROPONIN I: 0.03 ng/mL (ref <0.031)

## 2015-11-05 LAB — PROTIME-INR
INR: 1.34
PROTHROMBIN TIME: 16.7 s — AB (ref 11.4–15.0)

## 2015-11-05 LAB — ABO/RH: ABO/RH(D): O POS

## 2015-11-05 LAB — PREPARE RBC (CROSSMATCH)

## 2015-11-05 MED ORDER — MAGNESIUM SULFATE 4 GM/100ML IV SOLN
4.0000 g | Freq: Once | INTRAVENOUS | Status: AC
  Administered 2015-11-05: 4 g via INTRAVENOUS
  Filled 2015-11-05: qty 100

## 2015-11-05 MED ORDER — PANTOPRAZOLE SODIUM 40 MG IV SOLR
40.0000 mg | Freq: Every day | INTRAVENOUS | Status: DC
  Administered 2015-11-05 – 2015-11-06 (×2): 40 mg via INTRAVENOUS
  Filled 2015-11-05 (×2): qty 40

## 2015-11-05 MED ORDER — IOPAMIDOL (ISOVUE-300) INJECTION 61%
100.0000 mL | Freq: Once | INTRAVENOUS | Status: AC | PRN
Start: 2015-11-05 — End: 2015-11-05
  Administered 2015-11-05: 100 mL via INTRAVENOUS

## 2015-11-05 MED ORDER — SODIUM CHLORIDE 0.9 % IV SOLN
Freq: Once | INTRAVENOUS | Status: AC
  Administered 2015-11-06: 01:00:00 via INTRAVENOUS

## 2015-11-05 MED ORDER — MORPHINE SULFATE (PF) 2 MG/ML IV SOLN
1.0000 mg | Freq: Once | INTRAVENOUS | Status: AC
  Administered 2015-11-05: 1 mg via INTRAVENOUS
  Filled 2015-11-05: qty 1

## 2015-11-05 MED ORDER — LABETALOL HCL 5 MG/ML IV SOLN: 10.0000 mg | INTRAVENOUS | Status: DC | PRN

## 2015-11-05 MED ORDER — LORAZEPAM 2 MG/ML IJ SOLN
1.0000 mg | Freq: Two times a day (BID) | INTRAMUSCULAR | Status: DC
  Administered 2015-11-06: 1 mg via INTRAVENOUS
  Filled 2015-11-05: qty 1

## 2015-11-05 MED ORDER — FOLIC ACID 5 MG/ML IJ SOLN
1.0000 mg | Freq: Every day | INTRAMUSCULAR | Status: DC
  Administered 2015-11-05 – 2015-11-06 (×2): 1 mg via INTRAVENOUS
  Filled 2015-11-05 (×3): qty 0.2

## 2015-11-05 MED ORDER — LORAZEPAM 2 MG/ML IJ SOLN
INTRAMUSCULAR | Status: AC
  Filled 2015-11-05: qty 1

## 2015-11-05 MED ORDER — MORPHINE SULFATE (PF) 2 MG/ML IV SOLN
1.0000 mg | Freq: Once | INTRAVENOUS | Status: AC
Start: 2015-11-05 — End: 2015-11-05
  Administered 2015-11-05: 1 mg via INTRAVENOUS
  Filled 2015-11-05: qty 1

## 2015-11-05 MED ORDER — DEXTROSE-NACL 5-0.9 % IV SOLN
INTRAVENOUS | Status: DC
  Administered 2015-11-05: 75 mL/h via INTRAVENOUS
  Administered 2015-11-05 – 2015-11-06 (×2): via INTRAVENOUS

## 2015-11-05 MED ORDER — IPRATROPIUM-ALBUTEROL 0.5-2.5 (3) MG/3ML IN SOLN: 3.0000 mL | RESPIRATORY_TRACT | Status: DC | PRN

## 2015-11-05 MED ORDER — THIAMINE HCL 100 MG/ML IJ SOLN
100.0000 mg | Freq: Every day | INTRAMUSCULAR | Status: DC
  Administered 2015-11-05 – 2015-11-06 (×2): 100 mg via INTRAVENOUS
  Filled 2015-11-05 (×3): qty 1

## 2015-11-05 MED ORDER — SODIUM CHLORIDE 0.9 % IV SOLN: 10.0000 mL/h | Freq: Once | INTRAVENOUS | Status: DC

## 2015-11-05 MED ORDER — LORAZEPAM 2 MG/ML IJ SOLN
1.0000 mg | INTRAMUSCULAR | Status: DC | PRN
  Administered 2015-11-05: 1 mg via INTRAVENOUS
  Filled 2015-11-05: qty 1

## 2015-11-05 MED ORDER — LORAZEPAM 2 MG/ML IJ SOLN
0.5000 mg | Freq: Once | INTRAMUSCULAR | Status: AC
  Administered 2015-11-05: 0.5 mg via INTRAVENOUS

## 2015-11-05 NOTE — Progress Notes (Signed)
[facility]Link Physician-Brief Progress Note Patient Name: Raymond BoozeGeorge Conran Erickson DOB: June 09, 1955 MRN: [id number]   Date of Service  11/05/2015  HPI/Events of Note  Pt with abd pain, admitted for hemoperitoneum  eICU Interventions  1mg  morphine IV x 1     Intervention Category Major Interventions: Other:  Raymond Erickson 11/05/2015, 5:32 PM

## 2015-11-05 NOTE — H&P (Signed)
Chief Complaint: Hemoperitoneum  Referring Physician(s): Zenia Resides, NP Critical Care Medicine  Supervising Physician: Oley Balm  Patient Status: In-pt   History of Present Illness: Raymond Erickson is a 60 y.o. male smoker who developed abdominal pain yesterday morning.   He does not recall any particular event that incited his pain.   He denies having nausea, vomiting, or diarrhea, chest pain, dizziness, or dyspnea.   He went to [facility] ER.Labs there revealed Hgb of 6.4. He has been transfused 3 units PRBCs  CT abdomen/pelvis showed nodular liver concerning for cirrhosis, gallstones, and large volume hemoperitoneum with source appearing to be prominent collateral vessel in pericolic gutter.   [facility] ER provider d/w vascular surgery on call at [facility] who recommended IR evaluation and transfer to [facility].  He is followed by GI at [facility] for liver disease.   He drinks 1 pint of vodka per day.  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  We are asked to evaluate him for consideration of TIPS procedure.  Currently is he is stable. He is awake. Family members at bedside (Wife and daughter). They feel he has been appropriate and oriented and shows no signs of encephalopathy.  MELD score is 14 (using http://www.harvey.com/)  Past Medical History  Diagnosis Date  . Arthritis   . Hematuria   . Gout   . HLD (hyperlipidemia)   . HTN (hypertension)   . COPD with asthma (HCC)   . Gout   . Essential tremor   . Alcohol abuse     Past Surgical History  Procedure Laterality Date  . Hernia repair    . Broken nose      Allergies: Ace inhibitors and Lisinopril  Medications: Prior to Admission medications   Medication Sig Start Date End Date Taking? Authorizing Provider  allopurinol (ZYLOPRIM) 100 MG tablet Take 100 mg by mouth 2 (two) times daily.    Yes Historical Provider, MD  chlorthalidone (HYGROTON) 25 MG tablet take 1 tablet by  mouth once daily if needed 08/14/15  Yes Historical Provider, MD  hydrALAZINE (APRESOLINE) 50 MG tablet Take 50 mg by mouth 3 (three) times daily.    Yes Historical Provider, MD  magnesium oxide (MAG-OX) 400 MG tablet Take 400 mg by mouth 2 (two) times daily. 10/14/15 10/13/16 Yes Historical Provider, MD  potassium chloride SA (K-DUR,KLOR-CON) 20 MEQ tablet Take 20 mEq by mouth daily.   Yes Historical Provider, MD  propranolol (INDERAL) 40 MG tablet Take 40 mg by mouth 2 (two) times daily.  01/17/14  Yes Historical Provider, MD  cholecalciferol (VITAMIN D) 1000 units tablet Take 2,000 Units by mouth daily.    Historical Provider, MD  clonazePAM (KLONOPIN) 0.5 MG tablet Take 0.5 mg by mouth 2 (two) times daily as needed.  01/21/15   Historical Provider, MD  cloNIDine (CATAPRES) 0.1 MG tablet Take 0.1 mg by mouth 3 (three) times daily.     Historical Provider, MD  COMBIVENT RESPIMAT 20-100 MCG/ACT AERS respimat inhale 2 puffs by mouth three times a day if needed 08/22/15   Historical Provider, MD  Vitamin D, Ergocalciferol, (DRISDOL) 50000 units CAPS capsule  04/08/15   Historical Provider, MD     Family History  Problem Relation Age of Onset  . Cirrhosis Brother     Social History   Social History  . Marital Status: Single    Spouse Name: N/A  . Number of Children: N/A  . Years of Education:  N/A   Social History Main Topics  . Smoking status: Current Every Day Smoker  . Smokeless tobacco: None  . Alcohol Use: 60.0 oz/week    100 Standard drinks or equivalent per week  . Drug Use: No  . Sexual Activity: Not Asked   Other Topics Concern  . None   Social History Narrative    Review of Systems: A 12 point ROS discussed Review of Systems  Constitutional: Positive for chills and fatigue. Negative for fever, activity change and appetite change.  HENT: Negative.   Respiratory: Positive for cough. Negative for shortness of breath and wheezing.   Cardiovascular: Negative for chest pain.    Gastrointestinal: Positive for abdominal pain and abdominal distention. Negative for nausea and vomiting.  Genitourinary: Negative.   Musculoskeletal: Negative.   Hematological: Negative.   Psychiatric/Behavioral: Negative.     Vital Signs: BP 139/91 mmHg  Pulse 109  Temp(Src) 98.8 F (37.1 C) (Oral)  Resp 29  Ht 6\' 1"  (1.854 m)  Wt 206 lb 9.1 oz (93.7 kg)  BMI 27.26 kg/m2  SpO2 100%  Physical Exam  Constitutional: He is oriented to person, place, and time. He appears well-developed and well-nourished.  HENT:  Head: Atraumatic.  Eyes: EOM are normal. Scleral icterus is present.  Neck: Normal range of motion. Neck supple.  Cardiovascular: Regular rhythm.   Tachy  Pulmonary/Chest: Effort normal and breath sounds normal. No respiratory distress. He has no wheezes.  Abdominal: He exhibits distension. There is no tenderness.  Musculoskeletal: Normal range of motion.  Neurological: He is alert and oriented to person, place, and time.  Skin: Skin is warm and dry.  Psychiatric: He has a normal mood and affect. His behavior is normal. Judgment and thought content normal.  Vitals reviewed.   Mallampati Score:  MD Evaluation Airway: WNL Heart: WNL Abdomen: Other (comments) Abdomen comments: Moderate distension secondary to hemoperitoneum Chest/ Lungs: WNL ASA  Classification: 3 Mallampati/Airway Score: Two  Imaging: Ct Abdomen Pelvis W Contrast  11/05/2015  CLINICAL DATA:  Lower abdominal pain. Midline abdominal pain and hypotension. EXAM: CT ABDOMEN AND PELVIS WITH CONTRAST TECHNIQUE: Multidetector CT imaging of the abdomen and pelvis was performed using the standard protocol following bolus administration of intravenous contrast. CONTRAST:  100mL ISOVUE-300 IOPAMIDOL (ISOVUE-300) INJECTION 61% COMPARISON:  CT 09/12/2015 FINDINGS: Lower chest:  Motion artifact through the lung bases. Liver: Nodular contours consistent with cirrhosis. Liver is enlarged. Heterogeneous fluid  tracks along the anterior liver, no definite focal lesion is seen. There is re- cannula is umbilical vein. Hepatobiliary: Dependent gallstones. No disproportionate pericholecystic inflammation. Pancreas: No ductal dilatation or inflammation. Spleen: Normal in size. Adrenal glands: No nodule. Kidneys: Symmetric renal enhancement.  No hydronephrosis. Stomach/Bowel: Stomach physiologically distended. Moderate hiatal hernia distended with contrast. There are no dilated or thickened small bowel loops. Small volume of stool throughout the colon without colonic wall thickening. The appendix is normal. Vascular/Lymphatic: No retroperitoneal adenopathy. Abdominal aorta is normal in caliber. Atherosclerosis without aneurysm. Periaortic soft tissue stranding or fluid. Reproductive: Normal for age. Bladder: Minimally distended. Other: Large volume hemo peritoneum. This is most prominent in the right mid abdomen. Source appears to be a prominent collateral vessel coursing in the pericolic gutter. Origin of this collateral vessel appears mesenteric. There is blood tracking into the pelvis and right upper quadrant. More simple free fluid in the left upper quadrant and pelvis may be background ascites. Musculoskeletal: There are no acute or suspicious osseous abnormalities. Lumbar compression deformities are again seen. IMPRESSION: 1.  Large volume hemoperitoneum, most prominent in the right abdomen. Suspected source is a prominent collateral vessel coursing in the pericolic gutter, which appears to be is enteric in origin. Alternatively, i.e. bleeding hepatic lesion is considered, however felt less likely as no focal lesion was seen on CT performed last month. Patient does have underlying cirrhosis. 2. Additional chronic findings as described. Critical Value/emergent results were called by telephone at the time of interpretation on 11/05/2015 at 1:28 am to Dr. Chiquita Loth , who verbally acknowledged these results. Electronically  Signed   By: Rubye Oaks M.D.   On: 11/05/2015 01:29   Dg Chest Port 1 View  11/05/2015  CLINICAL DATA:  Acute onset of hypotension and generalized weakness. Jaundice. Initial encounter. EXAM: PORTABLE CHEST 1 VIEW COMPARISON:  None. FINDINGS: The lungs are well-aerated. Minimal left basilar atelectasis is noted. There is no evidence of pleural effusion or pneumothorax. The cardiomediastinal silhouette is within normal limits. No acute osseous abnormalities are seen. IMPRESSION: Minimal left basilar atelectasis noted.  Lungs otherwise clear. Electronically Signed   By: Roanna Raider M.D.   On: 11/05/2015 00:15    Labs:  CBC:  Recent Labs  11/05/15 0005 11/05/15 0647 11/05/15 1031  WBC 14.7*  --   --   HGB 6.4* 8.0* 7.7*  HCT 19.4* 23.3* 22.6*  PLT 148*  --   --     COAGS:  Recent Labs  11/05/15 0005  INR 1.34    BMP:  Recent Labs  08/29/15 1018 09/17/15 1651 11/05/15 0005 11/05/15 0647  NA  --  138 137 136  K  --  2.7* 3.0* 3.8  CL  --  90* 107 102  CO2  --  26 15* 22  GLUCOSE  --  124* 137* 142*  BUN 5* 3* 6 7  CALCIUM  --  8.2* 7.1* 7.7*  CREATININE 0.55* 0.64* 0.97 1.36*  GFRNONAA 116 109 >60 56*  GFRAA 134 126 >60 >60    LIVER FUNCTION TESTS:  Recent Labs  09/17/15 1651 11/05/15 0005  BILITOT 4.1* 3.4*  AST 169* 110*  ALT 21 16*  ALKPHOS 257* 176*  PROT 7.7 6.0*  ALBUMIN 3.0* 1.9*    TUMOR MARKERS: No results for input(s): AFPTM, CEA, CA199, CHROMGRNA in the last 8760 hours.  Assessment and Plan:  Hemoperitoneum = Source appears to be a prominent collateral vessel coursing in the pericolic gutter. Origin of this collateral vessel appears mesenteric, venous in origin, as a complication of portal HTN.    Dr. Deanne Coffer has reviewed the films. He is not a candidate for embolization.  If patient continues to bleed, TIPS may be necessary. MELD score is 14.  I have discussed the procedure with the patient and his wife and daughter.  I have  discussed risks and benefits including  discussed with the patient including bleeding, infection, renal failure, damage to adjacent structures, and encephalopathy.  Will follow.  Thank you for this interesting consult.  I greatly enjoyed meeting Sadler Teschner Erickson and look forward to participating in their care.  A copy of this report was sent to the requesting provider on this date.  Electronically Signed: Gwynneth Macleod PA-C 11/05/2015, 11:11 AM   I spent a total of 40 Minutes in face to face in clinical consultation, greater than 50% of which was counseling/coordinating care for consideration of TIPS procedure.

## 2015-11-05 NOTE — Progress Notes (Signed)
CRITICAL VALUE ALERT  Critical value received:  hgb 6.2  Date of notification:  11/05/15  Time of notification:  2328  Critical value read back:Yes.    Nurse who received alert:  Telford NabValerie Nuriya Stuck RN  MD notified (1st page):  Dr. Dema SeverinMungal  Time of first page:  2328  MD notified (2nd page): Dr. Isaiah SergeMannam  Time of second page: 2337  Responding MD:  Dr. Isaiah SergeMannam  Time MD responded:  2337

## 2015-11-05 NOTE — Progress Notes (Signed)
eLink Physician-Brief Progress Note Patient Name: Raymond Erickson DOB: Mar 22, 1956 MRN: [id number]   Date of Service  11/05/2015  HPI/Events of Note  Hb 6.2  eICU Interventions  Transfuse 2 units PRBC     Intervention Category Intermediate Interventions: Bleeding - evaluation and treatment with blood products  Kaylee Trivett 11/05/2015, 11:39 PM

## 2015-11-05 NOTE — ED Notes (Signed)
[facility] at bedside for transport. 

## 2015-11-05 NOTE — Progress Notes (Signed)
Anders SimmondsPete Babcock ANP notified of patient H & H no further orders. Will continue to monitor

## 2015-11-05 NOTE — H&P (Signed)
PULMONARY / CRITICAL CARE MEDICINE   Name: Raymond Erickson MRN: [id number] DOB: [birth year]    ADMISSION DATE:  11/05/2015  REFERRING MD:  Chiquita Loth, [facility] ER PCP: Dr. Patricia Pesa at [facility]  CHIEF COMPLAINT:  Abdominal pain.  HISTORY OF PRESENT ILLNESS:   60 yo male smoker developed abdominal pain in AM of 11/04/15.  He does not recall any particular event that incited his pain feeling.  He was not having nausea, vomiting, or diarrhea.  He still has his appetite.  He denies chest pain, dizziness, or dyspnea.  He went to [facility] ER.  He had lab work which showed Hb 6.4.  He had CT abdomen/pelvis which showed nodular liver concerning for cirrhosis, gallstones, mod hiatal hernia, and large volume hemoperitoneum with source appearing to be prominent collateral vessel in pericolic gutter.  [facility] ER provider d/w vascular surgery on call at [facility] >> recommendation was to discuss with IR and arrange for transfer to [facility].  He reports have hematuria and was seen by urology.  He had evaluation in [place] and second opinion at [facility].  There was no obvious cause for his hematuria, and there was question about whether he could have been spilling bilirubin in his urine which could cause red color to his urine. His PSA is elevated to 7.24 from 10/25/15.  He is followed by GI at [facility] for liver disease.  He drinks 1 pint of vodka a day.  He is followed by neurology at [facility] for essential tremor.    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  PAST MEDICAL HISTORY :  He  has a past medical history of Arthritis; Hematuria; Gout; HLD (hyperlipidemia); HTN (hypertension); COPD with asthma (HCC); Gout; Essential tremor; and Alcohol abuse.   PAST SURGICAL HISTORY: He  has past surgical history that includes Hernia repair and broken nose.  Allergies  Allergen Reactions  . Ace Inhibitors Swelling  . Lisinopril Swelling    No current facility-administered medications on file prior to  encounter.   Current Outpatient Prescriptions on File Prior to Encounter  Medication Sig  . allopurinol (ZYLOPRIM) 100 MG tablet Take 100 mg by mouth 2 (two) times daily.   . chlorthalidone (HYGROTON) 25 MG tablet take 1 tablet by mouth once daily if needed  . cholecalciferol (VITAMIN D) 1000 units tablet Take 2,000 Units by mouth daily.  . clonazePAM (KLONOPIN) 0.5 MG tablet Take 0.5 mg by mouth 2 (two) times daily as needed.   . cloNIDine (CATAPRES) 0.1 MG tablet Take 0.1 mg by mouth 3 (three) times daily.   . COMBIVENT RESPIMAT 20-100 MCG/ACT AERS respimat inhale 2 puffs by mouth three times a day if needed  . hydrALAZINE (APRESOLINE) 50 MG tablet Take 50 mg by mouth 3 (three) times daily.   . potassium chloride SA (K-DUR,KLOR-CON) 20 MEQ tablet Take 20 mEq by mouth daily.  . propranolol (INDERAL) 40 MG tablet Take 40 mg by mouth 2 (two) times daily.   . Vitamin D, Ergocalciferol, (DRISDOL) 50000 units CAPS capsule     FAMILY HISTORY:  His has no family status information on file.   SOCIAL HISTORY: He  reports that he has been smoking.  He does not have any smokeless tobacco history on file. He reports that he drinks about 60.0 oz of alcohol per week. He reports that he does not use illicit drugs.  REVIEW OF SYSTEMS:   Negative except above.  SUBJECTIVE:  Wants something to eat.  VITAL  SIGNS: Ht 6\' 1"  (1.854 m)  Wt 206 lb 9.1 oz (93.7 kg)  BMI 27.26 kg/m2  INTAKE / OUTPUT:    PHYSICAL EXAMINATION: General:  alert Neuro:  Normal strength, resting tremor, follows commands HEENT:  Pupils reactive, CN intact, no oral exudate, no LAN Cardiovascular:  Regular, tachycardic, no murmur Lungs:  No wheeze/rales Abdomen:  Mild distention, soft, decreased bowel sounds Musculoskeletal:  No edema, distal lower extremity pulses palpable b/l Skin: various areas of depigmented skin  LABS:  BMET  Recent Labs Lab 11/05/15 0005  NA 137  K 3.0*  CL 107  CO2 15*  BUN 6   CREATININE 0.97  GLUCOSE 137*    Electrolytes  Recent Labs Lab 11/05/15 0005  CALCIUM 7.1*    CBC  Recent Labs Lab 11/05/15 0005  WBC 14.7*  HGB 6.4*  HCT 19.4*  PLT 148*    Coag's  Recent Labs Lab 11/05/15 0005  INR 1.34    Sepsis Markers No results for input(s): LATICACIDVEN, PROCALCITON, O2SATVEN in the last 168 hours.  ABG No results for input(s): PHART, PCO2ART, PO2ART in the last 168 hours.  Liver Enzymes  Recent Labs Lab 11/05/15 0005  AST 110*  ALT 16*  ALKPHOS 176*  BILITOT 3.4*  ALBUMIN 1.9*    Cardiac Enzymes  Recent Labs Lab 11/05/15 0005  TROPONINI 0.03    Glucose No results for input(s): GLUCAP in the last 168 hours.  Imaging Ct Abdomen Pelvis W Contrast  11/05/2015  CLINICAL DATA:  Lower abdominal pain. Midline abdominal pain and hypotension. EXAM: CT ABDOMEN AND PELVIS WITH CONTRAST TECHNIQUE: Multidetector CT imaging of the abdomen and pelvis was performed using the standard protocol following bolus administration of intravenous contrast. CONTRAST:  100mL ISOVUE-300 IOPAMIDOL (ISOVUE-300) INJECTION 61% COMPARISON:  CT 09/12/2015 FINDINGS: Lower chest:  Motion artifact through the lung bases. Liver: Nodular contours consistent with cirrhosis. Liver is enlarged. Heterogeneous fluid tracks along the anterior liver, no definite focal lesion is seen. There is re- cannula is umbilical vein. Hepatobiliary: Dependent gallstones. No disproportionate pericholecystic inflammation. Pancreas: No ductal dilatation or inflammation. Spleen: Normal in size. Adrenal glands: No nodule. Kidneys: Symmetric renal enhancement.  No hydronephrosis. Stomach/Bowel: Stomach physiologically distended. Moderate hiatal hernia distended with contrast. There are no dilated or thickened small bowel loops. Small volume of stool throughout the colon without colonic wall thickening. The appendix is normal. Vascular/Lymphatic: No retroperitoneal adenopathy. Abdominal aorta  is normal in caliber. Atherosclerosis without aneurysm. Periaortic soft tissue stranding or fluid. Reproductive: Normal for age. Bladder: Minimally distended. Other: Large volume hemo peritoneum. This is most prominent in the right mid abdomen. Source appears to be a prominent collateral vessel coursing in the pericolic gutter. Origin of this collateral vessel appears mesenteric. There is blood tracking into the pelvis and right upper quadrant. More simple free fluid in the left upper quadrant and pelvis may be background ascites. Musculoskeletal: There are no acute or suspicious osseous abnormalities. Lumbar compression deformities are again seen. IMPRESSION: 1. Large volume hemoperitoneum, most prominent in the right abdomen. Suspected source is a prominent collateral vessel coursing in the pericolic gutter, which appears to be is enteric in origin. Alternatively, i.e. bleeding hepatic lesion is considered, however felt less likely as no focal lesion was seen on CT performed last month. Patient does have underlying cirrhosis. 2. Additional chronic findings as described. Critical Value/emergent results were called by telephone at the time of interpretation on 11/05/2015 at 1:28 am to Dr. Chiquita LothJADE SUNG , who verbally acknowledged these  results. Electronically Signed   By: Rubye Oaks M.D.   On: 11/05/2015 01:29   Dg Chest Port 1 View  11/05/2015  CLINICAL DATA:  Acute onset of hypotension and generalized weakness. Jaundice. Initial encounter. EXAM: PORTABLE CHEST 1 VIEW COMPARISON:  None. FINDINGS: The lungs are well-aerated. Minimal left basilar atelectasis is noted. There is no evidence of pleural effusion or pneumothorax. The cardiomediastinal silhouette is within normal limits. No acute osseous abnormalities are seen. IMPRESSION: Minimal left basilar atelectasis noted.  Lungs otherwise clear. Electronically Signed   By: Roanna Raider M.D.   On: 11/05/2015 00:15     STUDIES:  5/30 CT abd/pelvis >>  nodular liver concerning for cirrhosis, gallstones, mod hiatal hernia, and large volume hemoperitoneum with source appearing to be prominent collateral vessel in pericolic gutter  CULTURES:  ANTIBIOTICS:  SIGNIFICANT EVENTS: 5/30 Transfer from [facility] ER to [facility]  LINES/TUBES:  DISCUSSION: 60 yo male smoker with abdominal pain, anemia from hemoperitoneum.  He has hx of ETOH with cirrhosis.  ASSESSMENT / PLAN:  PULMONARY A: Hx of COPD/asthma. Tobacco abuse. P:   Monitor oxygenation Bronchial hygiene Prn duoneb  CARDIOVASCULAR A:  Sinus tachycardia, hypotension from hemoperitoneum >> improved after transfusion PRBC. Hx of HTN. P:  Continue IV fluids Monitor hemodynamics Prn labetalol for SBP > 170 Hold outpt chlorthalidone, catapres, hydralazine  RENAL A:   Hx of hypokalemia. Hx of hematuria with elevated PSA. P:   F/u and replace electrolytes as needed Check u/a  GASTROINTESTINAL A:   Hx of ETOH with cirrhosis. P:   NPO Protonix for SUP Thiamine, folic acid, dextrose in IV fluid CIWA q4h with prn ativan for CIWA > 8  HEMATOLOGIC A:   Hemoperitoneum. P:  F/u CBC Transfuse for Hb < 7 or bleeding Will need to d/w IR in AM if intervention is needed >> might need general surgery and/or vascular surgery to assess also SCDs  INFECTIOUS A:   No evidence for infection. P:   Monitor clinically  ENDOCRINE A:   Hx of Gout. P:   Hold outpt allopurinol  NEUROLOGIC A:   Hx of essential tremor. P:   Hold outpt klonopin, inderal for now   Updated pt's family at bedside.  D/w Dr. Myra Gianotti on call for vascular surgery at [facility] >> recommends consulting IR in AM of 5/30 and then determine clinical observation is warranted versus more aggressive intervention needed.  CC time 38 minutes.  Coralyn Helling, MD [facility] Pulmonary/Critical Care 11/05/2015, 6:08 AM Pager:  [phone] After 3pm call: [phone]

## 2015-11-05 NOTE — Progress Notes (Addendum)
PULMONARY / CRITICAL CARE MEDICINE   Name: Raymond Erickson MRN: [id number] DOB: 1955/07/25    ADMISSION DATE:  11/05/2015  REFERRING MD:  Chiquita Loth, [facility] ER PCP: Dr. Patricia Pesa at [facility]  CHIEF COMPLAINT:  Abdominal pain.  HISTORY OF PRESENT ILLNESS:   60 yo male smoker developed abdominal pain in AM of 11/04/15.  He did not recall any particular event that incited his pain feeling.Went to [facility] ER.  He had lab work which showed Hb 6.4.  He had CT abdomen/pelvis which showed nodular liver concerning for cirrhosis, gallstones, mod hiatal hernia, and large volume hemoperitoneum with source appearing to be prominent collateral vessel in pericolic gutter.  [facility] ER provider d/w vascular surgery on call at [facility] >> recommendation was to discuss with IR and arrange for transfer to [facility].  ->He reports have hematuria and was seen by urology.  He had evaluation in [place] and second opinion at [facility].  There was no obvious cause for his hematuria, and there was question about whether he could have been spilling bilirubin in his urine which could cause red color to his urine. His PSA is elevated to 7.24 from 10/25/15. -> He is followed by GI at [facility] for liver disease.  He drinks 1 pint of vodka a day. -> He is followed by neurology at [facility] for essential tremor.   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   SUBJECTIVE:  No distress.   VITAL SIGNS: BP 141/82 mmHg  Pulse 116  Temp(Src) 98.8 F (37.1 C) (Oral)  Resp 19  Ht  (1.854 m)  Wt 206 lb 9.1 oz (93.7 kg)  BMI 27.26 kg/m2  SpO2 99%  INTAKE / OUTPUT: I/O last 3 completed shifts: In: 28.8 [I.V.:28.8] Out: 200 [Urine:200]  PHYSICAL EXAMINATION: General:  Alert, no distress. He is impulsive.  Neuro:  Normal strength, resting tremor, follows commands HEENT:  Pupils reactive, CN intact, no oral exudate, no LAN Cardiovascular:  Regular, tachycardic, no murmur Lungs:  No wheeze/rales, no accessory use.   Abdomen:  Mild distention, soft, decreased bowel sounds Musculoskeletal:  No edema, distal lower extremity pulses palpable b/l Skin: various areas of depigmented skin  LABS:  BMET  Recent Labs Lab 11/05/15 0005 11/05/15 0647  NA 137 136  K 3.0* 3.8  CL 107 102  CO2 15* 22  BUN 6 7  CREATININE 0.97 1.36*  GLUCOSE 137* 142*    Electrolytes  Recent Labs Lab 11/05/15 0005 11/05/15 0647  CALCIUM 7.1* 7.7*  MG  --  1.3*  PHOS  --  5.2*    CBC  Recent Labs Lab 11/05/15 0005 11/05/15 0647  WBC 14.7*  --   HGB 6.4* 8.0*  HCT 19.4* 23.3*  PLT 148*  --     Coag's  Recent Labs Lab 11/05/15 0005  INR 1.34    Sepsis Markers No results for input(s): LATICACIDVEN, PROCALCITON, O2SATVEN in the last 168 hours.  ABG No results for input(s): PHART, PCO2ART, PO2ART in the last 168 hours.  Liver Enzymes  Recent Labs Lab 11/05/15 0005  AST 110*  ALT 16*  ALKPHOS 176*  BILITOT 3.4*  ALBUMIN 1.9*    Cardiac Enzymes  Recent Labs Lab 11/05/15 0005  TROPONINI 0.03    Glucose No results for input(s): GLUCAP in the last 168 hours.  Imaging Ct Abdomen Pelvis W Contrast  11/05/2015  CLINICAL DATA:  Lower abdominal pain. Midline abdominal pain and hypotension. EXAM: CT ABDOMEN AND  PELVIS WITH CONTRAST TECHNIQUE: Multidetector CT imaging of the abdomen and pelvis was performed using the standard protocol following bolus administration of intravenous contrast. CONTRAST:  100mL ISOVUE-300 IOPAMIDOL (ISOVUE-300) INJECTION 61% COMPARISON:  CT 09/12/2015 FINDINGS: Lower chest:  Motion artifact through the lung bases. Liver: Nodular contours consistent with cirrhosis. Liver is enlarged. Heterogeneous fluid tracks along the anterior liver, no definite focal lesion is seen. There is re- cannula is umbilical vein. Hepatobiliary: Dependent gallstones. No disproportionate pericholecystic inflammation. Pancreas: No ductal dilatation or inflammation. Spleen: Normal in size.  Adrenal glands: No nodule. Kidneys: Symmetric renal enhancement.  No hydronephrosis. Stomach/Bowel: Stomach physiologically distended. Moderate hiatal hernia distended with contrast. There are no dilated or thickened small bowel loops. Small volume of stool throughout the colon without colonic wall thickening. The appendix is normal. Vascular/Lymphatic: No retroperitoneal adenopathy. Abdominal aorta is normal in caliber. Atherosclerosis without aneurysm. Periaortic soft tissue stranding or fluid. Reproductive: Normal for age. Bladder: Minimally distended. Other: Large volume hemo peritoneum. This is most prominent in the right mid abdomen. Source appears to be a prominent collateral vessel coursing in the pericolic gutter. Origin of this collateral vessel appears mesenteric. There is blood tracking into the pelvis and right upper quadrant. More simple free fluid in the left upper quadrant and pelvis may be background ascites. Musculoskeletal: There are no acute or suspicious osseous abnormalities. Lumbar compression deformities are again seen. IMPRESSION: 1. Large volume hemoperitoneum, most prominent in the right abdomen. Suspected source is a prominent collateral vessel coursing in the pericolic gutter, which appears to be is enteric in origin. Alternatively, i.e. bleeding hepatic lesion is considered, however felt less likely as no focal lesion was seen on CT performed last month. Patient does have underlying cirrhosis. 2. Additional chronic findings as described. Critical Value/emergent results were called by telephone at the time of interpretation on 11/05/2015 at 1:28 am to Dr. Chiquita LothJADE SUNG , who verbally acknowledged these results. Electronically Signed   By: Rubye OaksMelanie  Ehinger M.D.   On: 11/05/2015 01:29   Dg Chest Port 1 View  11/05/2015  CLINICAL DATA:  Acute onset of hypotension and generalized weakness. Jaundice. Initial encounter. EXAM: PORTABLE CHEST 1 VIEW COMPARISON:  None. FINDINGS: The lungs are  well-aerated. Minimal left basilar atelectasis is noted. There is no evidence of pleural effusion or pneumothorax. The cardiomediastinal silhouette is within normal limits. No acute osseous abnormalities are seen. IMPRESSION: Minimal left basilar atelectasis noted.  Lungs otherwise clear. Electronically Signed   By: Roanna RaiderJeffery  Chang M.D.   On: 11/05/2015 00:15     STUDIES:  5/30 CT abd/pelvis >> nodular liver concerning for cirrhosis, gallstones, mod hiatal hernia, and large volume hemoperitoneum with source appearing to be prominent collateral vessel in pericolic gutter  CULTURES:  ANTIBIOTICS:  SIGNIFICANT EVENTS: 5/30 [facility]  LINES/TUBES:  DISCUSSION: 60 yo male smoker with abdominal pain, anemia from hemoperitoneum.  He has hx of ETOH with cirrhosis. He is s/p 3 units as of 5/30. Hemodynamically stable. Did have small bump in creatinine. Had been discussed w/ Vascular already. Have also spoke w/ IR. 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 Not a candidate for embolization. Only option would be coiling. Other issues are risk of w/d. For today plan is serial hgb and obs. If cont to bleed will need TIPS.   ASSESSMENT / PLAN:  PULMONARY A: Hx of COPD/asthma. Tobacco abuse At risk TRALI and overload P:   Monitor oxygenation Bronchial hygiene Prn  duoneb  CARDIOVASCULAR A:  Sinus tachycardia, hypotension from hemoperitoneum >> improved after transfusion PRBC. Hx of HTN. P:  Continue IV fluids Monitor hemodynamics Prn labetalol for SBP > 170 Hold outpt chlorthalidone, catapres, hydralazine  RENAL A:   Hx of hypokalemia. Hx of hematuria with elevated PSA. Mild AKI vs error, ATN likely hypomag P:   Increase IVFs and follow trend crt F/u and replace electrolytes as needed Check u/a, urine na, osm  GASTROINTESTINAL A:   Hx of ETOH with cirrhosis. P:   NPO Protonix for SUP Thiamine, folic acid, dextrose in IV fluid CIWA  q4h dc, [place] eMD driven  HEMATOLOGIC A:   Hemoperitoneum. S/p three units PRBC as of 5/30 ->source looks like collateral vessel from portal system. Not a candidate for coiling or embolization.  P:  F/u CBC Transfuse for Hb < 7 or bleeding SCDs Wood need TIPS if cont to bleed  INFECTIOUS A:   No evidence for infection. P:   Monitor clinically  ENDOCRINE A:   Hx of Gout. P:   Hold outpt allopurinol  NEUROLOGIC A:   Hx of essential tremor. At risk for ETOH withdrawl P:   Hold outpt klonopin, inderal for now Resume Klonopin as soon as can take orals PRN low dose ativan if develops evidence of w/d  Simonne Martinet ACNP-BC [facility] Pulmonary/Critical Care Pager # [phone] OR # [phone] if no answer  STAFF NOTE: I, Rory Percy, MD FACP have personally reviewed patient's available data, including medical history, events of note, physical examination and test results as part of my evaluation. I have discussed with resident/NP and other care providers such as pharmacist, RN and RRT. In addition, I personally evaluated patient and elicited key findings of: relaxed, rass 0, lungs clear, abdo tender diffuse, all scans reviewed, IR contacted for any options, appears that tips possible in future, following cbc, would avoid CIWA in icu, and will have schedule ativan MD driven, at risk trali, at risk overload, crt noted, error? Atn?, follow crt in pm, fluids okay for now, but overall want to use products when needed, cbc q8h, likely to need further transfusion, treat BP, keep sys less 150, i updated family in full, re assess coags in am for consumption risk The patient is critically ill with multiple organ systems failure and requires high complexity decision making for assessment and support, frequent evaluation and titration of therapies, application of advanced monitoring technologies and extensive interpretation of multiple databases.   Critical Care Time devoted to patient care  services described in this note is 45 Minutes. This time reflects time of care of this signee: Rory Percy, MD FACP. This critical care time does not reflect procedure time, or teaching time or supervisory time of PA/NP/Med student/Med Resident etc but could involve care discussion time. Rest per NP/medical resident whose note is outlined above and that I agree with   Mcarthur Rossetti. Tyson Alias, MD, FACP Pgr: [phone] [place] Pulmonary & Critical Care 11/05/2015 11:47 AM

## 2015-11-06 DIAGNOSIS — K661 Hemoperitoneum: Principal | ICD-10-CM

## 2015-11-06 LAB — PREPARE RBC (CROSSMATCH)

## 2015-11-06 LAB — TYPE AND SCREEN
ABO/RH(D): O POS
ANTIBODY SCREEN: NEGATIVE
Unit division: 0
Unit division: 0

## 2015-11-06 LAB — COMPREHENSIVE METABOLIC PANEL
ALK PHOS: 158 U/L — AB (ref 38–126)
ALT: 27 U/L (ref 17–63)
ANION GAP: 9 (ref 5–15)
AST: 199 U/L — AB (ref 15–41)
Albumin: 2.2 g/dL — ABNORMAL LOW (ref 3.5–5.0)
BILIRUBIN TOTAL: 6.4 mg/dL — AB (ref 0.3–1.2)
BUN: 9 mg/dL (ref 6–20)
CALCIUM: 7.7 mg/dL — AB (ref 8.9–10.3)
CO2: 22 mmol/L (ref 22–32)
Chloride: 105 mmol/L (ref 101–111)
Creatinine, Ser: 1.11 mg/dL (ref 0.61–1.24)
Glucose, Bld: 135 mg/dL — ABNORMAL HIGH (ref 65–99)
Potassium: 3.8 mmol/L (ref 3.5–5.1)
Sodium: 136 mmol/L (ref 135–145)
TOTAL PROTEIN: 7.2 g/dL (ref 6.5–8.1)

## 2015-11-06 LAB — PHOSPHORUS: PHOSPHORUS: 2.5 mg/dL (ref 2.5–4.6)

## 2015-11-06 LAB — CBC
HEMATOCRIT: 22.2 % — AB (ref 39.0–52.0)
HEMATOCRIT: 21.2 % — AB (ref 39.0–52.0)
HEMOGLOBIN: 7.5 g/dL — AB (ref 13.0–17.0)
HEMOGLOBIN: 7 g/dL — AB (ref 13.0–17.0)
MCH: 28.8 pg (ref 26.0–34.0)
MCH: 28.9 pg (ref 26.0–34.0)
MCHC: 33.8 g/dL (ref 30.0–36.0)
MCHC: 33 g/dL (ref 30.0–36.0)
MCV: 85.4 fL (ref 78.0–100.0)
MCV: 87.6 fL (ref 78.0–100.0)
Platelets: 132 10*3/uL — ABNORMAL LOW (ref 150–400)
Platelets: 97 10*3/uL — ABNORMAL LOW (ref 150–400)
RBC: 2.6 MIL/uL — ABNORMAL LOW (ref 4.22–5.81)
RBC: 2.42 MIL/uL — ABNORMAL LOW (ref 4.22–5.81)
RDW: 17.6 % — AB (ref 11.5–15.5)
RDW: 17.6 % — ABNORMAL HIGH (ref 11.5–15.5)
WBC: 16.9 10*3/uL — AB (ref 4.0–10.5)
WBC: 9.5 10*3/uL (ref 4.0–10.5)

## 2015-11-06 LAB — URINALYSIS, ROUTINE W REFLEX MICROSCOPIC
GLUCOSE, UA: NEGATIVE mg/dL
Hgb urine dipstick: NEGATIVE
Ketones, ur: 15 mg/dL — AB
NITRITE: POSITIVE — AB
PH: 5 (ref 5.0–8.0)
Protein, ur: NEGATIVE mg/dL
SPECIFIC GRAVITY, URINE: 1.03 (ref 1.005–1.030)

## 2015-11-06 LAB — SODIUM, URINE, RANDOM: Sodium, Ur: 50 mmol/L

## 2015-11-06 LAB — URINE MICROSCOPIC-ADD ON

## 2015-11-06 LAB — PROTIME-INR
INR: 1.44 (ref 0.00–1.49)
PROTHROMBIN TIME: 17.6 s — AB (ref 11.6–15.2)

## 2015-11-06 LAB — OSMOLALITY, URINE: OSMOLALITY UR: 513 mosm/kg (ref 300–900)

## 2015-11-06 LAB — MAGNESIUM: Magnesium: 2.1 mg/dL (ref 1.7–2.4)

## 2015-11-06 LAB — ABO/RH: ABO/RH(D): O POS

## 2015-11-06 LAB — APTT: aPTT: 35 seconds (ref 24–37)

## 2015-11-06 MED ORDER — MIDAZOLAM HCL 2 MG/2ML IJ SOLN
0.5000 mg | Freq: Once | INTRAMUSCULAR | Status: AC
  Administered 2015-11-06: 0.5 mg via INTRAVENOUS

## 2015-11-06 MED ORDER — CLONAZEPAM 0.5 MG PO TABS: 0.5000 mg | ORAL_TABLET | Freq: Two times a day (BID) | ORAL | Status: DC

## 2015-11-06 MED ORDER — HYDRALAZINE HCL 50 MG PO TABS
50.0000 mg | ORAL_TABLET | Freq: Three times a day (TID) | ORAL | Status: DC
  Administered 2015-11-06: 50 mg via ORAL
  Filled 2015-11-06 (×2): qty 1

## 2015-11-06 MED ORDER — PROPRANOLOL HCL 40 MG PO TABS
40.0000 mg | ORAL_TABLET | Freq: Two times a day (BID) | ORAL | Status: DC
  Administered 2015-11-06: 40 mg via ORAL
  Filled 2015-11-06: qty 1

## 2015-11-06 MED ORDER — CLONIDINE HCL 0.1 MG PO TABS
0.1000 mg | ORAL_TABLET | Freq: Three times a day (TID) | ORAL | Status: DC
  Administered 2015-11-06: 0.1 mg via ORAL
  Filled 2015-11-06: qty 1

## 2015-11-06 MED ORDER — LORAZEPAM 2 MG/ML IJ SOLN
1.0000 mg | Freq: Three times a day (TID) | INTRAMUSCULAR | Status: DC
  Administered 2015-11-06 – 2015-11-07 (×2): 1 mg via INTRAVENOUS
  Filled 2015-11-06: qty 1

## 2015-11-06 MED ORDER — PANTOPRAZOLE SODIUM 40 MG PO TBEC
40.0000 mg | DELAYED_RELEASE_TABLET | Freq: Every day | ORAL | Status: DC
  Administered 2015-11-07 – 2015-11-08 (×2): 40 mg via ORAL
  Filled 2015-11-06 (×2): qty 1

## 2015-11-06 MED ORDER — DEXMEDETOMIDINE HCL IN NACL 400 MCG/100ML IV SOLN
0.4000 ug/kg/h | INTRAVENOUS | Status: DC
  Administered 2015-11-06: 1 ug/kg/h via INTRAVENOUS
  Administered 2015-11-06 (×2): 0.4 ug/kg/h via INTRAVENOUS
  Filled 2015-11-06 (×2): qty 100

## 2015-11-06 MED ORDER — PHENYLEPHRINE HCL 10 MG/ML IJ SOLN
30.0000 ug/min | INTRAVENOUS | Status: DC
  Administered 2015-11-06: 30 ug/min via INTRAVENOUS
  Filled 2015-11-06: qty 1

## 2015-11-06 MED ORDER — SODIUM CHLORIDE 0.9 % IV BOLUS (SEPSIS)
250.0000 mL | Freq: Once | INTRAVENOUS | Status: AC
  Administered 2015-11-06: 250 mL via INTRAVENOUS

## 2015-11-06 MED ORDER — SODIUM CHLORIDE 0.9 % IV SOLN: Freq: Once | INTRAVENOUS | Status: AC

## 2015-11-06 NOTE — Care Management Note (Signed)
Case Management Note  Patient Details  Name: Raymond Erickson MRN: [id number] Date of Birth: 07-06-1955  Subjective/Objective:  Pt admitted with hemoperitoneum                  Action/Plan:  PTA pt independent from home alone.  CM will continue to monitor for disposition needs   Expected Discharge Date:                  Expected Discharge Plan:  Home/Self Care  In-House Referral:     Discharge planning Services  CM Consult  Post Acute Care Choice:    Choice offered to:     DME Arranged:    DME Agency:     HH Arranged:    HH Agency:     Status of Service:  In process, will continue to follow  Medicare Important Message Given:    Date Medicare IM Given:    Medicare IM give by:    Date Additional Medicare IM Given:    Additional Medicare Important Message give by:     If discussed at Long Length of Stay Meetings, dates discussed:    Additional Comments:  Raymond Erickson, Raymond Hetz S, RN 11/06/2015, 3:11 PM

## 2015-11-06 NOTE — Progress Notes (Signed)
Remains hypotensive Ordered blood Suspect precedex may be contributing. Will stop all his antihypertensives.  Start low dose neo  Simonne MartinetPeter E Chidera Thivierge ACNP-BC [facility] Pulmonary/Critical Care Pager # [phone] OR # [phone] if no answer

## 2015-11-06 NOTE — Progress Notes (Signed)
PULMONARY / CRITICAL CARE MEDICINE   Name: Raymond Erickson MRN: [id number] DOB: [birth year]    ADMISSION DATE:  11/05/2015  REFERRING MD:  Chiquita LothJade Sung, [facility] PCP: [facility]  CHIEF COMPLAINT:  Abdominal pain.  HISTORY OF PRESENT ILLNESS:   60 yo male smoker developed abdominal pain in AM of 11/04/15.  He did not recall any particular event that incited his pain feeling.[facility].  He had lab work which showed Hb 6.4.  He had CT abdomen/pelvis which showed nodular liver concerning for cirrhosis, gallstones, mod hiatal hernia, and large volume hemoperitoneum with source appearing to be prominent collateral vessel in pericolic gutter.  [facility] >> recommendation was to discuss with IR and [facility].  ->He reports have hematuria and was seen by urology.  [place] and [facility].  There was no obvious cause for his hematuria, and there was question about whether he could have been spilling bilirubin in his urine which could cause red color to his urine. His PSA is elevated to 7.24 from 10/25/15. -> [facility].  He drinks 1 pint of vodka a day. -> [facility].   -> [facility] discoid [facility].   SUBJECTIVE:  Now anxious and agitated  VITAL SIGNS: BP 142/75 mmHg  Pulse 114  Temp(Src) 98 F (36.7 C) (Oral)  Resp 22  Ht 6\' 1"  (1.854 m)  Wt 212 lb 15.4 oz (96.6 kg)  BMI 28.10 kg/m2  SpO2 100% Room air  INTAKE / OUTPUT: I/O last 3 completed shifts: In: 4010.8 [I.V.:2928.8; Blood:982; IV Piggyback:100] Out: 400 [Urine:200; Stool:200]  PHYSICAL EXAMINATION: General:  Alert, no distress. He is impulsive.  Neuro: agitated and anxious, Normal strength, resting tremor increased, follows commands HEENT:  Pupils reactive, CN intact, no oral exudate, no  LAN Cardiovascular:  Regular, tachycardic, no murmur Lungs:  No wheeze/rales, no accessory use.  Abdomen:  Mild distention, soft, decreased bowel sounds Musculoskeletal:  No edema, distal lower extremity pulses palpable b/l Skin: various areas of depigmented skin  LABS:  BMET  Recent Labs Lab 11/05/15 0005 11/05/15 0647 11/05/15 1811  NA 137 136 135  K 3.0* 3.8 3.3*  CL 107 102 102  CO2 15* 22 21*  BUN 6 7 9   CREATININE 0.97 1.36* 1.27*  GLUCOSE 137* 142* 209*    Electrolytes  Recent Labs Lab 11/05/15 0005 11/05/15 0647 11/05/15 1811  CALCIUM 7.1* 7.7* 7.8*  MG  --  1.3*  --   PHOS  --  5.2*  --     CBC  Recent Labs Lab 11/05/15 0005  11/05/15 1230 11/05/15 1811 11/05/15 2314  WBC 14.7*  --  15.5* 17.2*  --   HGB 6.4*  < > 7.2* 7.0* 6.2*  HCT 19.4*  < > 21.3* 20.6* 18.8*  PLT 148*  --  126* 167  --   < > = values in this interval not displayed.  Coag's  Recent Labs Lab 11/05/15 0005  INR 1.34    Sepsis Markers No results for input(s): LATICACIDVEN, PROCALCITON, O2SATVEN in the last 168 hours.  ABG No results for input(s): PHART, PCO2ART, PO2ART in the last 168 hours.  Liver Enzymes  Recent Labs Lab 11/05/15 0005  AST 110*  ALT 16*  ALKPHOS 176*  BILITOT 3.4*  ALBUMIN 1.9*    Cardiac Enzymes  Recent Labs Lab 11/05/15 0005  TROPONINI 0.03    Glucose  Recent Labs Lab 11/05/15 0451  GLUCAP 132*    Imaging No results found.   STUDIES:  5/30 CT abd/pelvis >> nodular liver concerning for cirrhosis, gallstones, mod hiatal hernia, and large volume hemoperitoneum with source appearing to be prominent collateral vessel in pericolic gutter  CULTURES:  ANTIBIOTICS:  SIGNIFICANT EVENTS: 5/30 Transfer from [facility] ER to [facility]  LINES/TUBES:  DISCUSSION: 60 yo male smoker with abdominal pain, anemia from hemoperitoneum.  He has hx of ETOH with cirrhosis. He is s/p 3 units as of 5/30. Hemodynamically stable. Did have small bump in  creatinine; this has improved. Had been discussed w/ Vascular already. Have also spoke w/ IR. 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 Not a candidate for embolization.  For today plan is serial hgb and obs. If cont to bleed will need TIPS. His issue for today is ETOH withdrawal.   ASSESSMENT / PLAN:  PULMONARY A: Hx of COPD/asthma. Tobacco abuse At risk TRALI and overload P:   Monitor oxygenation Bronchial hygiene Prn duoneb  CARDIOVASCULAR A:  Sinus tachycardia, hypotension from hemoperitoneum >> improved after transfusion PRBC. Hx of HTN. P:  Monitor hemodynamics resume catapres, hydralazine  RENAL A:   Hx of hypokalemia. Hx of hematuria with elevated PSA. Mild AKI -->improved P:   KVO IVFs F/u and replace electrolytes as needed  GASTROINTESTINAL A:   Hx of ETOH with cirrhosis. P:   NPO Protonix for SUP Thiamine, folic acid, dextrose in IV fluid  HEMATOLOGIC A:   Hemoperitoneum. S/p three units PRBC as of 5/30 and another 2 on 5/31 ->source looks like collateral vessel from portal system. Not a candidate for coiling or embolization.  P:  F/u CBC Transfuse for Hb < 7 or bleeding SCDs Would need TIPS if cont to bleed  INFECTIOUS A:   No evidence for infection. P:   Monitor clinically  ENDOCRINE A:   Hx of Gout. P:   Hold outpt allopurinol  NEUROLOGIC A:   Hx of essential tremor DTs P:   precedex gtt Resume Klonopin and propranolol   Family updated at bedside   Simonne Martinet ACNP-BC [facility] Pulmonary/Critical Care Pager # [phone] OR # [phone] if no answer   11/06/2015 9:35 AM  STAFF NOTE: IRory Percy, MD FACP have personally reviewed patient's available data, including medical history, events of note, physical examination and test results as part of my evaluation. I have discussed with resident/NP and other care providers such as pharmacist, RN and RRT. In addition, I personally evaluated patient  and elicited key findings of: awake, anxious, tachy, lungs clear, appearing to be having etoh WD, add precedex but add IV ativan scheduled for now low dose in settgin liver dz, cbc to follow post Tx, keep in icu, may need low dose fent also, no role tips as this time , await more evidence of active bleeding continued, tips with high risk , repeat coags re assuring, precedex start follow hr, bp closely, allow pos balance, tips would cause set back at thisd stage and hgb drop to be determined if active The patient is critically ill with multiple organ systems failure and requires high complexity decision making for assessment and support, frequent evaluation and titration of therapies, application of advanced monitoring technologies and extensive interpretation of multiple databases.   Critical Care  Time devoted to patient care services described in this note is 30 Minutes. This time reflects time of care of this signee: Rory Percy, MD FACP. This critical care time does not reflect procedure time, or teaching time or supervisory time of PA/NP/Med student/Med Resident etc but could involve care discussion time. Rest per NP/medical resident whose note is outlined above and that I agree with   Mcarthur Rossetti. Tyson Alias, MD, FACP Pgr: [phone] [place] Pulmonary & Critical Care 11/06/2015 9:43 AM

## 2015-11-07 LAB — TYPE AND SCREEN
ABO/RH(D): O POS
Antibody Screen: NEGATIVE
UNIT DIVISION: 0
UNIT DIVISION: 0
Unit division: 0

## 2015-11-07 LAB — CBC
HCT: 23.8 % — ABNORMAL LOW (ref 39.0–52.0)
HEMATOCRIT: 24.5 % — AB (ref 39.0–52.0)
Hemoglobin: 8.1 g/dL — ABNORMAL LOW (ref 13.0–17.0)
Hemoglobin: 7.9 g/dL — ABNORMAL LOW (ref 13.0–17.0)
MCH: 29.3 pg (ref 26.0–34.0)
MCH: 28.9 pg (ref 26.0–34.0)
MCHC: 33.1 g/dL (ref 30.0–36.0)
MCHC: 33.2 g/dL (ref 30.0–36.0)
MCV: 88.8 fL (ref 78.0–100.0)
MCV: 87.2 fL (ref 78.0–100.0)
PLATELETS: 105 10*3/uL — AB (ref 150–400)
Platelets: 123 10*3/uL — ABNORMAL LOW (ref 150–400)
RBC: 2.76 MIL/uL — ABNORMAL LOW (ref 4.22–5.81)
RBC: 2.73 MIL/uL — ABNORMAL LOW (ref 4.22–5.81)
RDW: 17.9 % — AB (ref 11.5–15.5)
RDW: 17.2 % — AB (ref 11.5–15.5)
WBC: 11.1 10*3/uL — AB (ref 4.0–10.5)
WBC: 11.3 10*3/uL — ABNORMAL HIGH (ref 4.0–10.5)

## 2015-11-07 MED ORDER — CLONAZEPAM 0.5 MG PO TABS
0.5000 mg | ORAL_TABLET | Freq: Two times a day (BID) | ORAL | Status: DC
  Administered 2015-11-07 – 2015-11-08 (×4): 0.5 mg via ORAL
  Filled 2015-11-07 (×4): qty 1

## 2015-11-07 MED ORDER — PROPRANOLOL HCL 40 MG PO TABS
40.0000 mg | ORAL_TABLET | Freq: Two times a day (BID) | ORAL | Status: DC
  Filled 2015-11-07: qty 1

## 2015-11-07 MED ORDER — FOLIC ACID 1 MG PO TABS
1.0000 mg | ORAL_TABLET | Freq: Every day | ORAL | Status: DC
Start: 2015-11-07 — End: 2015-11-09
  Administered 2015-11-07 – 2015-11-08 (×2): 1 mg via ORAL
  Filled 2015-11-07 (×2): qty 1

## 2015-11-07 MED ORDER — PROPRANOLOL HCL 40 MG PO TABS
40.0000 mg | ORAL_TABLET | Freq: Two times a day (BID) | ORAL | Status: DC
  Administered 2015-11-07 – 2015-11-09 (×3): 40 mg via ORAL
  Filled 2015-11-07 (×5): qty 1

## 2015-11-07 MED ORDER — LORAZEPAM 2 MG/ML IJ SOLN
0.5000 mg | Freq: Four times a day (QID) | INTRAMUSCULAR | Status: DC | PRN
  Administered 2015-11-08 – 2015-11-09 (×3): 0.5 mg via INTRAVENOUS
  Filled 2015-11-07 (×4): qty 1

## 2015-11-07 NOTE — Progress Notes (Addendum)
PULMONARY / CRITICAL CARE MEDICINE   Name: Raymond Erickson MRN: [id number] DOB: [birth year]    ADMISSION DATE:  11/05/2015  REFERRING MD:  Chiquita LothJade Sung, [facility] PCP: [facility]  CHIEF COMPLAINT:  Abdominal pain.  HISTORY OF PRESENT ILLNESS:   60 yo male smoker developed abdominal pain in AM of 11/04/15.  He did not recall any particular event that incited his pain feeling.[facility].  He had lab work which showed Hb 6.4.  He had CT abdomen/pelvis which showed nodular liver concerning for cirrhosis, gallstones, mod hiatal hernia, and large volume hemoperitoneum with source appearing to be prominent collateral vessel in pericolic gutter.  [facility] >> recommendation was to discuss with IR and [facility].  ->He reports have hematuria and was seen by urology.  [place] in [place] and [facility].  There was no obvious cause for his hematuria, and there was question about whether he could have been spilling bilirubin in his urine which could cause red color to his urine. His PSA is elevated to 7.24 from 10/25/15. -> [facility].  He drinks 1 pint of vodka a day. -> [facility].   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   SUBJECTIVE:  Looks better   VITAL SIGNS: BP 98/64 mmHg  Pulse 72  Temp(Src) 98.9 F (37.2 C) (Oral)  Resp 20  Ht 6\' 1"  (1.854 m)  Wt 212 lb 15.4 oz (96.6 kg)  BMI 28.10 kg/m2  SpO2 100% Room air  INTAKE / OUTPUT: I/O last 3 completed shifts: In: 3482.6 [I.V.:2165.6; Blood:1317] Out: -   PHYSICAL EXAMINATION: General:  Alert, no distress. He is impulsive. But calmer than yesterday  Neuro:  Normal strength, resting tremor increased, follows commands, oriented today but still rambles off  HEENT:  Pupils reactive, CN intact, no oral exudate, no  LAN Cardiovascular:  Regular, tachycardic, no murmur Lungs:  No wheeze/rales, no accessory use.  Abdomen:  Mild distention, soft, decreased bowel sounds Musculoskeletal:  No edema, distal lower extremity pulses palpable b/l Skin: various areas of depigmented skin  LABS:  BMET  Recent Labs Lab 11/05/15 0647 11/05/15 1811 11/06/15 1007  NA 136 135 136  K 3.8 3.3* 3.8  CL 102 102 105  CO2 22 21* 22  BUN 7 9 9   CREATININE 1.36* 1.27* 1.11  GLUCOSE 142* 209* 135*    Electrolytes  Recent Labs Lab 11/05/15 0647 11/05/15 1811 11/06/15 1007  CALCIUM 7.7* 7.8* 7.7*  MG 1.3*  --  2.1  PHOS 5.2*  --  2.5    CBC  Recent Labs Lab 11/05/15 1811 11/05/15 2314 11/06/15 1007 11/07/15 0759  WBC 17.2*  --  16.9* 11.1*  HGB 7.0* 6.2* 7.5* 8.1*  HCT 20.6* 18.8* 22.2* 24.5*  PLT 167  --  132* 105*    Coag's  Recent Labs Lab 11/05/15 0005 11/06/15 1007  APTT  --  35  INR 1.34 1.44    Sepsis Markers No results for input(s): LATICACIDVEN, PROCALCITON, O2SATVEN in the last 168 hours.  ABG No results for input(s): PHART, PCO2ART, PO2ART in the last 168 hours.  Liver Enzymes  Recent Labs Lab 11/05/15 0005 11/06/15 1007  AST 110* 199*  ALT 16* 27  ALKPHOS 176* 158*  BILITOT 3.4* 6.4*  ALBUMIN 1.9* 2.2*    Cardiac Enzymes  Recent Labs Lab 11/05/15 0005  TROPONINI 0.03    Glucose  Recent Labs Lab 11/05/15 0451  GLUCAP 132*    Imaging No results found.   STUDIES:  5/30 CT abd/pelvis >> nodular liver concerning for cirrhosis, gallstones, mod hiatal hernia, and large volume hemoperitoneum with source appearing to be prominent collateral vessel in pericolic gutter  CULTURES:  ANTIBIOTICS:  SIGNIFICANT EVENTS: 5/30 Transfer from [facility] to [facility]  LINES/TUBES:  DISCUSSION: 60 yo male smoker with abdominal pain, anemia from hemoperitoneum.  He has hx of ETOH with cirrhosis. He is s/p 3 units as of 5/30. Hemodynamically stable. Did have small  bump in creatinine; this has improved. Had been discussed w/ Vascular already. Have also spoke w/ IR. 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 Not a candidate for embolization. His w/d symptoms seems to have improved. His hgb has improved. Today we will move him out of ICU, adv diet and de-escalate CBC trending.   ASSESSMENT / PLAN:  PULMONARY A: Hx of COPD/asthma. Tobacco abuse At risk TRALI and overload P:   Monitor oxygenation Bronchial hygiene Prn duoneb  CARDIOVASCULAR A:  Sinus tachycardia, hypotension from hemoperitoneum >> improved after transfusion PRBC. Hx of HTN. P:  Monitor hemodynamics  RENAL A:   Hx of hematuria with elevated PSA. Mild AKI -->improved P:   KVO IVFs F/u and replace electrolytes as needed  GASTROINTESTINAL A:   Hx of ETOH with cirrhosis. P:   Adv diet Trend CBC  HEMATOLOGIC A:   Hemoperitoneum. S/p five units PRBC as 6/1 ->source looks like collateral vessel from portal system. Not a candidate for coiling or embolization.  P:  F/u CBC Transfuse for Hb < 7 or bleeding SCDs Would need TIPS if cont to bleed  INFECTIOUS A:   No evidence for infection. P:   Monitor clinically  ENDOCRINE A:   Hx of Gout. P:   Hold outpt allopurinol  NEUROLOGIC A:   Hx of essential tremor DTs-->improved P:   Resume Klonopin and propranolol   Family updated at bedside   Simonne Martinet ACNP-BC [facility] Pulmonary/Critical Care Pager # [phone] OR # [phone] if no answer   11/07/2015 10:27 AM   11/07/2015 10:27 AM  STAFF NOTE: Cindi Carbon, MD FACP have personally reviewed patient's available data, including medical history, events of note, physical examination and test results as part of my evaluation. I have discussed with resident/NP and other care providers such as pharmacist, RN and RRT. In addition, I personally evaluated patient and elicited key findings of: awake, calm, off precedex, cbc hct  stable, no bleeding clinically , BP wnl, not tachy at rest, improved etoh WD, to oral home benzo, cbc to q12h , to sdu, follow abdo examination, to triad, repeat plat in am , consumption , dilution likely in setting liver dz, family updated, re assess for tips pre dc   Mcarthur Rossetti. Tyson Alias, MD, FACP Pgr: [phone] [place] Pulmonary & Critical Care 11/07/2015 12:12 PM

## 2015-11-08 ENCOUNTER — Inpatient Hospital Stay (HOSPITAL_COMMUNITY): Payer: BLUE CROSS/BLUE SHIELD

## 2015-11-08 DIAGNOSIS — I9589 Other hypotension: Secondary | ICD-10-CM

## 2015-11-08 DIAGNOSIS — R000 Tachycardia, unspecified: Secondary | ICD-10-CM

## 2015-11-08 DIAGNOSIS — I509 Heart failure, unspecified: Secondary | ICD-10-CM

## 2015-11-08 DIAGNOSIS — R319 Hematuria, unspecified: Secondary | ICD-10-CM

## 2015-11-08 DIAGNOSIS — K7031 Alcoholic cirrhosis of liver with ascites: Secondary | ICD-10-CM

## 2015-11-08 DIAGNOSIS — J438 Other emphysema: Secondary | ICD-10-CM

## 2015-11-08 DIAGNOSIS — N179 Acute kidney failure, unspecified: Secondary | ICD-10-CM

## 2015-11-08 DIAGNOSIS — F1010 Alcohol abuse, uncomplicated: Secondary | ICD-10-CM

## 2015-11-08 DIAGNOSIS — F10231 Alcohol dependence with withdrawal delirium: Secondary | ICD-10-CM

## 2015-11-08 DIAGNOSIS — Z720 Tobacco use: Secondary | ICD-10-CM

## 2015-11-08 LAB — CBC
HCT: 27.6 % — ABNORMAL LOW (ref 39.0–52.0)
HEMOGLOBIN: 9 g/dL — AB (ref 13.0–17.0)
MCH: 29.5 pg (ref 26.0–34.0)
MCHC: 32.6 g/dL (ref 30.0–36.0)
MCV: 90.5 fL (ref 78.0–100.0)
PLATELETS: 142 10*3/uL — AB (ref 150–400)
RBC: 3.05 MIL/uL — AB (ref 4.22–5.81)
RDW: 17.9 % — ABNORMAL HIGH (ref 11.5–15.5)
WBC: 12.4 10*3/uL — AB (ref 4.0–10.5)

## 2015-11-08 LAB — ECHOCARDIOGRAM COMPLETE
Height: 73 in
WEIGHTICAEL: 3495.61 [oz_av]

## 2015-11-08 LAB — GLUCOSE, CAPILLARY: GLUCOSE-CAPILLARY: 114 mg/dL — AB (ref 65–99)

## 2015-11-08 NOTE — Progress Notes (Addendum)
PROGRESS NOTE    Raymond Erickson  WUJ:[id number] DOB: [birth year] DOA: 11/05/2015 PCP: Rafael Bihari, MD   Brief Narrative:  60 yo BM PMHx Tobacco abuse, EtOH abuse (1 pint of Vodka/day), EtOH Liver Cirrhosis, HTN, HLD, COPD w/ Asthma, Essential tremor  Developed abdominal pain in AM of 11/04/15. He does not recall any particular event that incited his pain feeling. He was not having nausea, vomiting, or diarrhea. He still has his appetite. He denies chest pain, dizziness, or dyspnea. He went to [facility] ER. He had lab work which showed Hb 6.4. He had CT abdomen/pelvis which showed nodular liver concerning for cirrhosis, gallstones, mod hiatal hernia, and large volume hemoperitoneum with source appearing to be prominent collateral vessel in pericolic gutter. [facility] ER provider d/w vascular surgery on call at [facility] >> recommendation was to discuss with IR and arrange for transfer to [facility].  He reports have hematuria and was seen by urology. He had evaluation in [place] and second opinion at [facility]. There was no obvious cause for his hematuria, and there was question about whether he could have been spilling bilirubin in his urine which could cause red color to his urine. His PSA is elevated to 7.24 from 10/25/15.  He is followed by GI at [facility] for liver disease. He drinks 1 pint of vodka a day.  He is followed by neurology at [facility] for essential tremor.   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   Assessment & Plan:   Active Problems:   Hemoperitoneum   Chronic obstructive pulmonary disease (HCC)   Tobacco abuse   Sinus tachycardia (HCC)   Hypotension   Hematuria   Acute renal failure (HCC)   ETOH abuse   Alcoholic cirrhosis of liver with ascites (HCC)   Delirium tremens (HCC)   COPD/asthma.  Tobacco abuse -Titrate O2 to maintain SPO2 89-93%  Sinus tachycardia, hypotension from hemoperitoneum . -Resolved -Echocardiogram  pending  HTN. -Propranolol 40 mg BID  Hematuria with elevated PSA. -Continued hematuria  Acute renal failure Lab Results  Component Value Date   CREATININE 1.50* 11/09/2015   CREATININE 1.11 11/06/2015   CREATININE 1.27* 11/05/2015    EtOH abuse/ETOH Liver Cirrhosis.   Hemoperitoneum.  -5/30 transfuse 2 units PRBC  -5/31 transfused 3 units PRBC  -source looks like collateral vessel from portal system. Not a candidate for coiling or embolization.   Recent Labs Lab 11/06/15 2210 11/07/15 0759 11/07/15 2006 11/08/15 0720 11/09/15 0410  HGB 7.0* 8.1* 7.9* 9.0* 8.7*  -At risk Transfusion-related acute lung injury(TRALI)and overload -Transfuse for Hb < 7 or bleeding -Would need TIPS if cont to bleed  Gout. -Hold outpt allopurinol -Uric acid level pending  Essential tremor  Delirium Tremens -Klonopin 0.5 mg BID -See HTN   Goals of care -Will need to address CODE STATUS given patient's multisystem organ failure    DVT prophylaxis: SCD Code Status: Full Family Communication: Family present Disposition Plan: ??   Consultants:    Procedures/Significant Events:  5/30 CT abd/pelvis >> nodular liver concerning for cirrhosis, gallstones, mod hiatal hernia, and large volume hemoperitoneum with source appearing to be prominent collateral vessel in pericolic gutter -9/56 transfuse 2 units PRBC  -5/31 transfused 3 units PRBC    Cultures   Antimicrobials:    Devices    LINES / TUBES:      Continuous Infusions: . dextrose 5 % and 0.9% NaCl 10 mL/hr at 11/07/15 1900     Subjective: 6/2  A/O 4, combative, flights of thought.  Objective: Filed Vitals:   11/08/15 1437 11/08/15 1542 11/08/15 2114 11/09/15 0437  BP: 116/73 122/80 154/93 135/79  Pulse: 78 80 76 71  Temp: 98.4 F (36.9 C) 97.8 F (36.6 C) 98.5 F (36.9 C) 99 F (37.2 C)  TempSrc: Oral Oral Oral Oral  Resp: Height:      Weight:      SpO2: 100% 100% 100% 100%     Intake/Output Summary (Last 24 hours) at 11/09/15 8657 Last data filed at 11/08/15 2248  Gross per 24 hour  Intake    350 ml  Output    100 ml  Net    250 ml   Filed Weights   11/05/15 0447 11/06/15 0413 11/08/15 0600  Weight: 93.7 kg (206 lb 9.1 oz) 96.6 kg (212 lb 15.4 oz) 99.1 kg (218 lb 7.6 oz)    Examination:  General: A/O 4, combative, No acute respiratory distress Eyes: negative scleral hemorrhage, negative anisocoria, positive icterus ENT: Negative Runny nose, negative gingival bleeding, Neck:  Negative scars, masses, torticollis, lymphadenopathy, JVD Lungs: diffuse rhonchi, without wheezes or crackles Cardiovascular: Regular rate and rhythm without murmur gallop or rub normal S1 and S2 Abdomen: negative abdominal pain, positive distention, positive soft, bowel sounds, no rebound, positive ascites, no appreciable mass Extremities: No significant cyanosis, clubbing, or edema bilateral lower extremities, positive tremors Skin: Negative rashes, lesions, ulcers Psychiatric:  Negative depression, negative anxiety, negative fatigue, positive mania Central nervous system:  Cranial nerves II through XII intact, tongue/uvula midline, positive dysarthria, negative expressive aphasia, negative receptive aphasia.  .     Data Reviewed: Care during the described time interval was provided by me .  I have reviewed this patient's available data, including medical history, events of note, physical examination, and all test results as part of my evaluation. I have personally reviewed and interpreted all radiology studies.  CBC:  Recent Labs Lab 11/05/15 0005  11/06/15 2210 11/07/15 0759 11/07/15 2006 11/08/15 0720 11/09/15 0410  WBC 14.7*  < > 9.5 11.1* 11.3* 12.4* 12.9*  NEUTROABS 12.1*  --   --   --   --   --   --   HGB 6.4*  < > 7.0* 8.1* 7.9* 9.0* 8.7*  HCT 19.4*  < > 21.2* 24.5* 23.8* 27.6* 27.0*  MCV 96.6  < > 87.6 88.8 87.2 90.5 89.4  PLT 148*  < > 97* 105* 123*  142* 174  < > = values in this interval not displayed. Basic Metabolic Panel:  Recent Labs Lab 11/05/15 0005 11/05/15 0647 11/05/15 1811 11/06/15 1007 11/09/15 0410  NA 137 136 135 136 135  K 3.0* 3.8 3.3* 3.8 4.1  CL 107 102 102 105 104  CO2 15* 22 21* 22 22  GLUCOSE 137* 142* 209* 135* 97  BUN CREATININE 0.97 1.36* 1.27* 1.11 1.50*  CALCIUM 7.1* 7.7* 7.8* 7.7* 8.7*  MG  --  1.3*  --  2.1 2.1  PHOS  --  5.2*  --  2.5  --    GFR: Estimated Creatinine Clearance: 67.3 mL/min (by C-G formula based on Cr of 1.5). Liver Function Tests:  Recent Labs Lab 11/05/15 0005 11/06/15 1007  AST 110* 199*  ALT 16* 27  ALKPHOS 176* 158*  BILITOT 3.4* 6.4*  PROT 6.0* 7.2  ALBUMIN 1.9* 2.2*    Recent Labs Lab 11/05/15 0005  LIPASE 54*    Recent Labs  Lab 11/09/15 0419  AMMONIA 53*   Coagulation Profile:  Recent Labs Lab 11/05/15 0005 11/06/15 1007  INR 1.34 1.44   Cardiac Enzymes:  Recent Labs Lab 11/05/15 0005  TROPONINI 0.03   BNP (last 3 results) No results for input(s): PROBNP in the last 8760 hours. HbA1C: No results for input(s): HGBA1C in the last 72 hours. CBG:  Recent Labs Lab 11/05/15 0451 11/08/15 1547  GLUCAP 132* 114*   Lipid Profile: No results for input(s): CHOL, HDL, LDLCALC, TRIG, CHOLHDL, LDLDIRECT in the last 72 hours. Thyroid Function Tests: No results for input(s): TSH, T4TOTAL, FREET4, T3FREE, THYROIDAB in the last 72 hours. Anemia Panel: No results for input(s): VITAMINB12, FOLATE, FERRITIN, TIBC, IRON, RETICCTPCT in the last 72 hours. Urine analysis:    Component Value Date/Time   COLORURINE ORANGE* 11/06/2015 0236   APPEARANCEUR CLEAR 11/06/2015 0236   APPEARANCEUR Clear 09/17/2015 1631   LABSPEC 1.030 11/06/2015 0236   PHURINE 5.0 11/06/2015 0236   GLUCOSEU NEGATIVE 11/06/2015 0236   HGBUR NEGATIVE 11/06/2015 0236   BILIRUBINUR MODERATE* 11/06/2015 0236   BILIRUBINUR Positive* 09/17/2015 1631   KETONESUR  15* 11/06/2015 0236   PROTEINUR NEGATIVE 11/06/2015 0236   PROTEINUR 1+* 09/17/2015 1631   NITRITE POSITIVE* 11/06/2015 0236   NITRITE Negative 09/17/2015 1631   LEUKOCYTESUR SMALL* 11/06/2015 0236   LEUKOCYTESUR Negative 09/17/2015 1631   Sepsis Labs: @LABRCNTIP (procalcitonin:4,lacticidven:4)  ) Recent Results (from the past 240 hour(s))  MRSA PCR Screening     Status: None   Collection Time: 11/05/15  5:02 AM  Result Value Ref Range Status   MRSA by PCR NEGATIVE NEGATIVE Final    Comment:        The GeneXpert MRSA Assay (FDA approved for NASAL specimens only), is one component of a comprehensive MRSA colonization surveillance program. It is not intended to diagnose MRSA infection nor to guide or monitor treatment for MRSA infections.          Radiology Studies: No results found.      Scheduled Meds: . clonazePAM  0.5 mg Oral BID  . folic acid  1 mg Oral Daily  . pantoprazole  40 mg Oral QHS  . propranolol  40 mg Oral BID   Continuous Infusions: . dextrose 5 % and 0.9% NaCl 10 mL/hr at 11/07/15 1900     LOS: 4 days    Time spent: 40 minutes    Jalyn Dutta, Roselind MessierURTIS J, MD [facility] Pager [phone]   If 7PM-7AM, please contact night-coverage [email or web address] Password [email or web address] 11/09/2015, 8:33 AM

## 2015-11-08 NOTE — Progress Notes (Signed)
  Echocardiogram 2D Echocardiogram has been performed.  Janalyn HarderWest, Malavika Lira R 11/08/2015, 3:34 PM

## 2015-11-09 DIAGNOSIS — J449 Chronic obstructive pulmonary disease, unspecified: Secondary | ICD-10-CM | POA: Diagnosis present

## 2015-11-09 DIAGNOSIS — F10931 Alcohol use, unspecified with withdrawal delirium: Secondary | ICD-10-CM | POA: Diagnosis present

## 2015-11-09 DIAGNOSIS — R000 Tachycardia, unspecified: Secondary | ICD-10-CM | POA: Diagnosis present

## 2015-11-09 DIAGNOSIS — K7030 Alcoholic cirrhosis of liver without ascites: Secondary | ICD-10-CM | POA: Diagnosis present

## 2015-11-09 DIAGNOSIS — F10231 Alcohol dependence with withdrawal delirium: Secondary | ICD-10-CM | POA: Diagnosis present

## 2015-11-09 DIAGNOSIS — R319 Hematuria, unspecified: Secondary | ICD-10-CM | POA: Diagnosis present

## 2015-11-09 DIAGNOSIS — Z720 Tobacco use: Secondary | ICD-10-CM | POA: Diagnosis present

## 2015-11-09 DIAGNOSIS — I959 Hypotension, unspecified: Secondary | ICD-10-CM | POA: Diagnosis present

## 2015-11-09 DIAGNOSIS — F1010 Alcohol abuse, uncomplicated: Secondary | ICD-10-CM | POA: Diagnosis present

## 2015-11-09 DIAGNOSIS — N179 Acute kidney failure, unspecified: Secondary | ICD-10-CM | POA: Diagnosis present

## 2015-11-09 DIAGNOSIS — K7031 Alcoholic cirrhosis of liver with ascites: Secondary | ICD-10-CM | POA: Diagnosis present

## 2015-11-09 LAB — BASIC METABOLIC PANEL
Anion gap: 9 (ref 5–15)
BUN: 18 mg/dL (ref 6–20)
CHLORIDE: 104 mmol/L (ref 101–111)
CO2: 22 mmol/L (ref 22–32)
CREATININE: 1.5 mg/dL — AB (ref 0.61–1.24)
Calcium: 8.7 mg/dL — ABNORMAL LOW (ref 8.9–10.3)
GFR, EST AFRICAN AMERICAN: 58 mL/min — AB (ref >60)
GFR, EST NON AFRICAN AMERICAN: 50 mL/min — AB (ref >60)
Glucose, Bld: 97 mg/dL (ref 65–99)
POTASSIUM: 4.1 mmol/L (ref 3.5–5.1)
SODIUM: 135 mmol/L (ref 135–145)

## 2015-11-09 LAB — CBC
HEMATOCRIT: 27 % — AB (ref 39.0–52.0)
HEMOGLOBIN: 8.7 g/dL — AB (ref 13.0–17.0)
MCH: 28.8 pg (ref 26.0–34.0)
MCHC: 32.2 g/dL (ref 30.0–36.0)
MCV: 89.4 fL (ref 78.0–100.0)
Platelets: 174 10*3/uL (ref 150–400)
RBC: 3.02 MIL/uL — ABNORMAL LOW (ref 4.22–5.81)
RDW: 17.9 % — AB (ref 11.5–15.5)
WBC: 12.9 10*3/uL — AB (ref 4.0–10.5)

## 2015-11-09 LAB — URIC ACID: URIC ACID, SERUM: 7.7 mg/dL — AB (ref 4.4–7.6)

## 2015-11-09 LAB — MAGNESIUM: MAGNESIUM: 2.1 mg/dL (ref 1.7–2.4)

## 2015-11-09 LAB — AMMONIA: Ammonia: 53 umol/L — ABNORMAL HIGH (ref 9–35)

## 2015-11-09 NOTE — Discharge Summary (Signed)
Physician Discharge Summary  Lonney Revak III ZOX:[id number] DOB: 06/09/1955 DOA: 11/05/2015  PCP: Rafael Bihari, MD  Admit date: 11/05/2015 Discharge date: 11/09/2015  Time spent:0 minutes  Recommendations for Outpatient Follow-up:  COPD/asthma.  Tobacco abuse -Titrate O2 to maintain SPO2 89-93%  Sinus tachycardia, hypotension from hemoperitoneum . -Resolved -Echocardiogram pending  HTN. -Propranolol 40 mg BID  Hematuria with elevated PSA. -Continued hematuria  Acute renal failure  EtOH abuse/ETOH Liver Cirrhosis.   Hemoperitoneum.  -5/30 transfuse 2 units PRBC  -5/31 transfused 3 units PRBC  -source looks like collateral vessel from portal system. Not a candidate for coiling or embolization.  -At risk Transfusion-related acute lung injury(TRALI)and overload -Transfuse for Hb < 7 or bleeding -Would need TIPS if cont to bleed  Gout. -Hold outpt allopurinol -Uric acid level pending  Essential tremor  Delirium Tremens -Klonopin 0.5 mg BID -See HTN   NOTE; had spoken at length last night with patient and wife concerning the need to cooperate with staff in diagnosing and appropriately treating patient. On 6/3 received page from RN Lurena Joiner that patient had removed telemetry, gotten dressed, is refusing further treatment. Patient left AMA  Discharge Diagnoses:  Active Problems:   Hemoperitoneum   Chronic obstructive pulmonary disease (HCC)   Tobacco abuse   Sinus tachycardia (HCC)   Hypotension   Hematuria   Acute renal failure (HCC)   ETOH abuse   Alcoholic cirrhosis of liver with ascites (HCC)   Delirium tremens [facility])   Discharge Condition:   Diet recommendation:   Filed Weights   11/05/15 0447 11/06/15 0413 11/08/15 0600  Weight: 93.7 kg (206 lb 9.1 oz) 96.6 kg (212 lb 15.4 oz) 99.1 kg (218 lb 7.6 oz)   59 yo BM PMHx Tobacco abuse, EtOH abuse (1 pint of Vodka/day), EtOH Liver Cirrhosis, HTN, HLD, COPD w/ Asthma, Essential tremor  Developed  abdominal pain in AM of 11/04/15. He does not recall any particular event that incited his pain feeling. He was not having nausea, vomiting, or diarrhea. He still has his appetite. He denies chest pain, dizziness, or dyspnea. He went to [facility] ER. He had lab work which showed Hb 6.4. He had CT abdomen/pelvis which showed nodular liver concerning for cirrhosis, gallstones, mod hiatal hernia, and large volume hemoperitoneum with source appearing to be prominent collateral vessel in pericolic gutter. [facility] ER provider d/w vascular surgery on call at [facility] >> recommendation was to discuss with IR and arrange for transfer to [facility].  He reports have hematuria and was seen by urology. He had evaluation in [place] and second opinion at [facility]. There was no obvious cause for his hematuria, and there was question about whether he could have been spilling bilirubin in his urine which could cause red color to his urine. His PSA is elevated to 7.24 from 10/25/15.  He is followed by GI at [facility] for liver disease. He drinks 1 pint of vodka a day.  He is followed by neurology at [facility] for essential tremor.   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  Procedures/Significant Events:  5/30 CT abd/pelvis >> nodular liver concerning for cirrhosis, gallstones, mod hiatal hernia, and large volume hemoperitoneum with source appearing to be prominent collateral vessel in pericolic gutter -8/11 transfuse 2 units PRBC  -5/31 transfused 3 units PRBC    Discharge Exam: Filed Vitals:   11/08/15 1542 11/08/15 2114 11/09/15 0437 11/09/15 0845  BP: 122/80 154/93 135/79 164/89  Pulse: 80 76 71 72  Temp: 97.8 F (36.6 C) 98.5 F (36.9 C) 99 F (37.2 C) 97.6 F (36.4 C)  TempSrc: Oral Oral Oral Oral  Resp: 20 20 19 18   Height:      Weight:      SpO2: 100% 100% 100% 100%    PATIENT LEFT AMA      Discharge Instructions     Medication List    ASK your doctor about these  medications        allopurinol 100 MG tablet  Commonly known as:  ZYLOPRIM  Take 100 mg by mouth 2 (two) times daily.     chlorthalidone 25 MG tablet  Commonly known as:  HYGROTON  take 1 tablet by mouth once daily if needed for pain     clonazePAM 0.5 MG tablet  Commonly known as:  KLONOPIN  Take 0.5 mg by mouth 2 (two) times daily as needed for anxiety.     cloNIDine 0.1 MG tablet  Commonly known as:  CATAPRES  Take 0.1 mg by mouth 3 (three) times daily.     COMBIVENT RESPIMAT 20-100 MCG/ACT Aers respimat  Generic drug:  Ipratropium-Albuterol  inhale 2 puffs by mouth three times a day if needed for wheezing     folic acid 1 MG tablet  Commonly known as:  FOLVITE  Take 1 mg by mouth daily.     hydrALAZINE 50 MG tablet  Commonly known as:  APRESOLINE  Take 50 mg by mouth 3 (three) times daily.     magnesium oxide 400 MG tablet  Commonly known as:  MAG-OX  Take 400 mg by mouth 2 (two) times daily.     MULTI-VITAMINS Tabs  Take 1 tablet by mouth daily.     potassium chloride SA 20 MEQ tablet  Commonly known as:  K-DUR,KLOR-CON  Take 20 mEq by mouth daily.     propranolol 40 MG tablet  Commonly known as:  INDERAL  Take 40 mg by mouth 2 (two) times daily.     thiamine 100 MG tablet  Take 100 mg by mouth daily.     Vitamin D (Ergocalciferol) 50000 units Caps capsule  Commonly known as:  DRISDOL  Take 50,000 Units by mouth every 7 (seven) days.       Allergies  Allergen Reactions  . Ace Inhibitors Swelling  . Lisinopril Swelling      The results of significant diagnostics from this hospitalization (including imaging, microbiology, ancillary and laboratory) are listed below for reference.    Significant Diagnostic Studies: Ct Abdomen Pelvis W Contrast  11/05/2015  CLINICAL DATA:  Lower abdominal pain. Midline abdominal pain and hypotension. EXAM: CT ABDOMEN AND PELVIS WITH CONTRAST TECHNIQUE: Multidetector CT imaging of the abdomen and pelvis was performed  using the standard protocol following bolus administration of intravenous contrast. CONTRAST:  ISOVUE-300 IOPAMIDOL (ISOVUE-300) INJECTION 61% COMPARISON:  CT 09/12/2015 FINDINGS: Lower chest:  Motion artifact through the lung bases. Liver: Nodular contours consistent with cirrhosis. Liver is enlarged. Heterogeneous fluid tracks along the anterior liver, no definite focal lesion is seen. There is re- cannula is umbilical vein. Hepatobiliary: Dependent gallstones. No disproportionate pericholecystic inflammation. Pancreas: No ductal dilatation or inflammation. Spleen: Normal in size. Adrenal glands: No nodule. Kidneys: Symmetric renal enhancement.  No hydronephrosis. Stomach/Bowel: Stomach physiologically distended. Moderate hiatal hernia distended with contrast. There are no dilated or thickened small bowel loops. Small volume of stool throughout the colon without colonic wall thickening. The appendix is normal. Vascular/Lymphatic: No retroperitoneal adenopathy. Abdominal aorta is  normal in caliber. Atherosclerosis without aneurysm. Periaortic soft tissue stranding or fluid. Reproductive: Normal for age. Bladder: Minimally distended. Other: Large volume hemo peritoneum. This is most prominent in the right mid abdomen. Source appears to be a prominent collateral vessel coursing in the pericolic gutter. Origin of this collateral vessel appears mesenteric. There is blood tracking into the pelvis and right upper quadrant. More simple free fluid in the left upper quadrant and pelvis may be background ascites. Musculoskeletal: There are no acute or suspicious osseous abnormalities. Lumbar compression deformities are again seen. IMPRESSION: 1. Large volume hemoperitoneum, most prominent in the right abdomen. Suspected source is a prominent collateral vessel coursing in the pericolic gutter, which appears to be is enteric in origin. Alternatively, i.e. bleeding hepatic lesion is considered, however felt less likely as  no focal lesion was seen on CT performed last month. Patient does have underlying cirrhosis. 2. Additional chronic findings as described. Critical Value/emergent results were called by telephone at the time of interpretation on 11/05/2015 at 1:28 am to Dr. Chiquita LothJADE SUNG , who verbally acknowledged these results. Electronically Signed   By: Rubye OaksMelanie  Ehinger M.D.   On: 11/05/2015 01:29   Dg Chest Port 1 View  11/05/2015  CLINICAL DATA:  Acute onset of hypotension and generalized weakness. Jaundice. Initial encounter. EXAM: PORTABLE CHEST 1 VIEW COMPARISON:  None. FINDINGS: The lungs are well-aerated. Minimal left basilar atelectasis is noted. There is no evidence of pleural effusion or pneumothorax. The cardiomediastinal silhouette is within normal limits. No acute osseous abnormalities are seen. IMPRESSION: Minimal left basilar atelectasis noted.  Lungs otherwise clear. Electronically Signed   By: Roanna RaiderJeffery  Chang M.D.   On: 11/05/2015 00:15    Microbiology: Recent Results (from the past 240 hour(s))  MRSA PCR Screening     Status: None   Collection Time: 11/05/15  5:02 AM  Result Value Ref Range Status   MRSA by PCR NEGATIVE NEGATIVE Final    Comment:        The GeneXpert MRSA Assay (FDA approved for NASAL specimens only), is one component of a comprehensive MRSA colonization surveillance program. It is not intended to diagnose MRSA infection nor to guide or monitor treatment for MRSA infections.      Labs: Basic Metabolic Panel:  Recent Labs Lab 11/05/15 0005 11/05/15 0647 11/05/15 1811 11/06/15 1007 11/09/15 0410  NA 137 136 135 136 135  K 3.0* 3.8 3.3* 3.8 4.1  CL 107 102 102 105 104  CO2 15* 22 21* 22 22  GLUCOSE 137* 142* 209* 135* 97  BUN 6 7 9 9 18   CREATININE 0.97 1.36* 1.27* 1.11 1.50*  CALCIUM 7.1* 7.7* 7.8* 7.7* 8.7*  MG  --  1.3*  --  2.1 2.1  PHOS  --  5.2*  --  2.5  --    Liver Function Tests:  Recent Labs Lab 11/05/15 0005 11/06/15 1007  AST 110* 199*  ALT  16* 27  ALKPHOS 176* 158*  BILITOT 3.4* 6.4*  PROT 6.0* 7.2  ALBUMIN 1.9* 2.2*    Recent Labs Lab 11/05/15 0005  LIPASE 54*    Recent Labs Lab 11/09/15 0419  AMMONIA 53*   CBC:  Recent Labs Lab 11/05/15 0005  11/06/15 2210 11/07/15 0759 11/07/15 2006 11/08/15 0720 11/09/15 0410  WBC 14.7*  < > 9.5 11.1* 11.3* 12.4* 12.9*  NEUTROABS 12.1*  --   --   --   --   --   --   HGB 6.4*  < >  7.0* 8.1* 7.9* 9.0* 8.7*  HCT 19.4*  < > 21.2* 24.5* 23.8* 27.6* 27.0*  MCV 96.6  < > 87.6 88.8 87.2 90.5 89.4  PLT 148*  < > 97* 105* 123* 142* 174  < > = values in this interval not displayed. Cardiac Enzymes:  Recent Labs Lab 11/05/15 0005  TROPONINI 0.03   BNP: BNP (last 3 results) No results for input(s): BNP in the last 8760 hours.  ProBNP (last 3 results) No results for input(s): PROBNP in the last 8760 hours.  CBG:  Recent Labs Lab 11/05/15 0451 11/08/15 1547  GLUCAP 132* 114*       Signed:  Carolyne Littles, MD [facility] [phone] pager

## 2015-11-09 NOTE — Progress Notes (Signed)
Patient signed out AMA.  AMA form signed by patient and nurse; placed in patient's chart.  MD notified.

## 2015-11-09 NOTE — Progress Notes (Signed)
Patient took off telemetry box and refusing to wear.  CMT and MD notified.

## 2015-12-24 ENCOUNTER — Other Ambulatory Visit: Payer: BLUE CROSS/BLUE SHIELD

## 2015-12-31 ENCOUNTER — Encounter: Payer: Self-pay | Admitting: Urology

## 2015-12-31 ENCOUNTER — Encounter: Payer: BLUE CROSS/BLUE SHIELD | Admitting: Urology

## 2015-12-31 NOTE — Progress Notes (Signed)
This encounter was created in error - please disregard.

## 2016-07-24 ENCOUNTER — Emergency Department: Payer: BLUE CROSS/BLUE SHIELD

## 2016-07-24 ENCOUNTER — Observation Stay
Admission: EM | Admit: 2016-07-24 | Discharge: 2016-07-25 | Disposition: A | Discharge status: Home / Self Care | Payer: BLUE CROSS/BLUE SHIELD | Attending: Internal Medicine | Admitting: Internal Medicine

## 2016-07-24 ENCOUNTER — Encounter: Payer: Self-pay | Admitting: Emergency Medicine

## 2016-07-24 DIAGNOSIS — J449 Chronic obstructive pulmonary disease, unspecified: Secondary | ICD-10-CM | POA: Diagnosis not present

## 2016-07-24 DIAGNOSIS — D696 Thrombocytopenia, unspecified: Secondary | ICD-10-CM | POA: Diagnosis present

## 2016-07-24 DIAGNOSIS — Z79899 Other long term (current) drug therapy: Secondary | ICD-10-CM | POA: Insufficient documentation

## 2016-07-24 DIAGNOSIS — I10 Essential (primary) hypertension: Secondary | ICD-10-CM | POA: Insufficient documentation

## 2016-07-24 DIAGNOSIS — F1011 Alcohol abuse, in remission: Secondary | ICD-10-CM | POA: Diagnosis not present

## 2016-07-24 DIAGNOSIS — K7030 Alcoholic cirrhosis of liver without ascites: Secondary | ICD-10-CM | POA: Insufficient documentation

## 2016-07-24 DIAGNOSIS — F17290 Nicotine dependence, other tobacco product, uncomplicated: Secondary | ICD-10-CM | POA: Diagnosis not present

## 2016-07-24 DIAGNOSIS — M109 Gout, unspecified: Secondary | ICD-10-CM | POA: Diagnosis not present

## 2016-07-24 DIAGNOSIS — R0602 Shortness of breath: Secondary | ICD-10-CM | POA: Diagnosis not present

## 2016-07-24 DIAGNOSIS — R161 Splenomegaly, not elsewhere classified: Secondary | ICD-10-CM

## 2016-07-24 DIAGNOSIS — K7460 Unspecified cirrhosis of liver: Secondary | ICD-10-CM

## 2016-07-24 DIAGNOSIS — R042 Hemoptysis: Secondary | ICD-10-CM | POA: Diagnosis not present

## 2016-07-24 LAB — APTT: APTT: 49 s — AB (ref 24–36)

## 2016-07-24 LAB — BASIC METABOLIC PANEL
ANION GAP: 7 (ref 5–15)
BUN: 13 mg/dL (ref 6–20)
CO2: 20 mmol/L — ABNORMAL LOW (ref 22–32)
Calcium: 8.1 mg/dL — ABNORMAL LOW (ref 8.9–10.3)
Chloride: 105 mmol/L (ref 101–111)
Creatinine, Ser: 0.97 mg/dL (ref 0.61–1.24)
GLUCOSE: 107 mg/dL — AB (ref 65–99)
POTASSIUM: 3.5 mmol/L (ref 3.5–5.1)
Sodium: 132 mmol/L — ABNORMAL LOW (ref 135–145)

## 2016-07-24 LAB — CBC WITH DIFFERENTIAL/PLATELET
BASOS ABS: 0 10*3/uL (ref 0–0.1)
Basophils Relative: 1 %
EOS PCT: 2 %
Eosinophils Absolute: 0.2 10*3/uL (ref 0–0.7)
HCT: 30.1 % — ABNORMAL LOW (ref 40.0–52.0)
Hemoglobin: 9.9 g/dL — ABNORMAL LOW (ref 13.0–18.0)
LYMPHS PCT: 28 %
Lymphs Abs: 2.2 10*3/uL (ref 1.0–3.6)
MCH: 27.6 pg (ref 26.0–34.0)
MCHC: 33.1 g/dL (ref 32.0–36.0)
MCV: 83.4 fL (ref 80.0–100.0)
Monocytes Absolute: 0.8 10*3/uL (ref 0.2–1.0)
Monocytes Relative: 10 %
NEUTROS ABS: 4.7 10*3/uL (ref 1.4–6.5)
Neutrophils Relative %: 59 %
PLATELETS: 20 10*3/uL — AB (ref 150–440)
RBC: 3.6 MIL/uL — AB (ref 4.40–5.90)
RDW: 18 % — ABNORMAL HIGH (ref 11.5–14.5)
WBC: 8 10*3/uL (ref 3.8–10.6)

## 2016-07-24 LAB — PROTIME-INR
INR: 1.28
Prothrombin Time: 16.1 seconds — ABNORMAL HIGH (ref 11.4–15.2)

## 2016-07-24 LAB — PLATELET COUNT: PLATELETS: 24 10*3/uL — AB (ref 150–440)

## 2016-07-24 MED ORDER — NICOTINE 14 MG/24HR TD PT24
14.0000 mg | MEDICATED_PATCH | Freq: Every day | TRANSDERMAL | Status: DC
  Filled 2016-07-24: qty 1

## 2016-07-24 MED ORDER — ACETAMINOPHEN 325 MG PO TABS: 650.0000 mg | ORAL_TABLET | Freq: Four times a day (QID) | ORAL | Status: DC | PRN

## 2016-07-24 MED ORDER — SPIRONOLACTONE 25 MG PO TABS
50.0000 mg | ORAL_TABLET | Freq: Every day | ORAL | Status: DC
  Administered 2016-07-24 – 2016-07-25 (×2): 50 mg via ORAL
  Filled 2016-07-24 (×2): qty 2

## 2016-07-24 MED ORDER — VITAMIN B-1 100 MG PO TABS
100.0000 mg | ORAL_TABLET | Freq: Every day | ORAL | Status: DC
  Administered 2016-07-24 – 2016-07-25 (×2): 100 mg via ORAL
  Filled 2016-07-24 (×4): qty 1

## 2016-07-24 MED ORDER — ACETAMINOPHEN 650 MG RE SUPP: 650.0000 mg | Freq: Four times a day (QID) | RECTAL | Status: DC | PRN

## 2016-07-24 MED ORDER — THIAMINE MONONITRATE 100 MG PO TABS: 100.0000 mg | ORAL_TABLET | Freq: Every day | ORAL | Status: DC

## 2016-07-24 MED ORDER — ONDANSETRON HCL 4 MG PO TABS: 4.0000 mg | ORAL_TABLET | Freq: Four times a day (QID) | ORAL | Status: DC | PRN

## 2016-07-24 MED ORDER — PROPRANOLOL HCL 10 MG PO TABS
40.0000 mg | ORAL_TABLET | Freq: Two times a day (BID) | ORAL | Status: DC
  Administered 2016-07-24 – 2016-07-25 (×2): 40 mg via ORAL
  Filled 2016-07-24 (×2): qty 4

## 2016-07-24 MED ORDER — CIPROFLOXACIN HCL 500 MG PO TABS
500.0000 mg | ORAL_TABLET | Freq: Every day | ORAL | Status: DC
  Administered 2016-07-25: 500 mg via ORAL
  Filled 2016-07-24: qty 1

## 2016-07-24 MED ORDER — PANTOPRAZOLE SODIUM 40 MG PO TBEC
40.0000 mg | DELAYED_RELEASE_TABLET | Freq: Two times a day (BID) | ORAL | Status: DC
  Administered 2016-07-24 – 2016-07-25 (×2): 40 mg via ORAL
  Filled 2016-07-24 (×2): qty 1

## 2016-07-24 MED ORDER — ALBUTEROL SULFATE (2.5 MG/3ML) 0.083% IN NEBU
5.0000 mg | INHALATION_SOLUTION | Freq: Once | RESPIRATORY_TRACT | Status: AC
  Administered 2016-07-24: 5 mg via RESPIRATORY_TRACT
  Filled 2016-07-24: qty 6

## 2016-07-24 MED ORDER — ONDANSETRON HCL 4 MG/2ML IJ SOLN: 4.0000 mg | Freq: Four times a day (QID) | INTRAMUSCULAR | Status: DC | PRN

## 2016-07-24 MED ORDER — SODIUM CHLORIDE 0.9% FLUSH
3.0000 mL | Freq: Two times a day (BID) | INTRAVENOUS | Status: DC
  Administered 2016-07-24 – 2016-07-25 (×2): 3 mL via INTRAVENOUS

## 2016-07-24 MED ORDER — FOLIC ACID 1 MG PO TABS
1.0000 mg | ORAL_TABLET | Freq: Every day | ORAL | Status: DC
  Administered 2016-07-24 – 2016-07-25 (×2): 1 mg via ORAL
  Filled 2016-07-24 (×2): qty 1

## 2016-07-24 MED ORDER — SODIUM CHLORIDE 0.9 % IV SOLN: 10.0000 mL/h | Freq: Once | INTRAVENOUS | Status: DC

## 2016-07-24 MED ORDER — CLONAZEPAM 0.5 MG PO TABS: 0.5000 mg | ORAL_TABLET | Freq: Two times a day (BID) | ORAL | Status: DC | PRN

## 2016-07-24 MED ORDER — ALLOPURINOL 100 MG PO TABS
100.0000 mg | ORAL_TABLET | Freq: Two times a day (BID) | ORAL | Status: DC
  Administered 2016-07-24 – 2016-07-25 (×2): 100 mg via ORAL
  Filled 2016-07-24 (×2): qty 1

## 2016-07-24 NOTE — ED Notes (Signed)
Blood bank called. Platelets have been ordered and should be available in one to one and one half hours.

## 2016-07-24 NOTE — ED Notes (Signed)
Admitting MD at bedside.

## 2016-07-24 NOTE — ED Triage Notes (Signed)
Patient presents to ED via POV with c/o SOB and diarrhea that started last night. Patient speaking in full sentences. A&O x4.

## 2016-07-24 NOTE — ED Notes (Signed)
Discussed critical platelet count with Dr. Silverio LayYao.

## 2016-07-24 NOTE — ED Provider Notes (Signed)
[facility] Emergency Department Provider Note    ____________________________________________   I have reviewed the triage vital signs and the nursing notes.   HISTORY  Chief Complaint Shortness of Breath   History limited by: Not Limited   HPI Raymond Erickson is a 61 y.o. male who presents to the emergency department today with primary concern for shortness of breath and weakness. He states the symptoms started yesterday. They did start suddenly. Patient has had some associated cough. He has noticed some blood in his phlegm. The patient denies any fevers. He denies any chest pain. He states that many people at work have been sick.    Past Medical History:  Diagnosis Date  . Alcohol abuse   . Arthritis   . COPD with asthma (HCC)   . Essential tremor   . Gout   . Gout   . Hematuria   . HLD (hyperlipidemia)   . HTN (hypertension)     Patient Active Problem List   Diagnosis Date Noted  . Chronic obstructive pulmonary disease (HCC)   . Tobacco abuse   . Sinus tachycardia   . Hypotension   . Hematuria   . Acute renal failure (HCC)   . ETOH abuse   . Alcoholic cirrhosis of liver with ascites (HCC)   . Delirium tremens (HCC)   . Hemoperitoneum 11/05/2015  . Gross hematuria 08/29/2015  . 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 History of elevated PSA 08/29/2015  . Other hemorrhoids 08/29/2015  . Elevated prostate specific antigen (PSA) 04/26/2014  . AA (alcohol abuse) 04/18/2014  . 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 Injury of kidney 04/15/2014  . Brain disorder 04/15/2014  . 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 Gout 04/15/2014  . Decreased potassium in the blood 04/15/2014  . Hypomagnesemia 04/15/2014  . Thrombocytopenia (HCC) 04/15/2014  . Disorder of hip region 12/12/2013  . Airway hyperreactivity 11/22/2013  . BP (high blood pressure) 11/22/2013  . HLD (hyperlipidemia) 11/22/2013    Past Surgical  History:  Procedure Laterality Date  . broken nose    . HERNIA REPAIR      Prior to Admission medications   Medication Sig Start Date End Date Taking? Authorizing Provider  allopurinol (ZYLOPRIM) 100 MG tablet Take 100 mg by mouth 2 (two) times daily.     Historical Provider, MD  benzonatate (TESSALON) 100 MG capsule take 1 capsule by mouth three times a day if needed for cough for 7 days 11/28/15   Historical Provider, MD  chlorthalidone (HYGROTON) 25 MG tablet take 1 tablet by mouth once daily if needed for pain 08/14/15   Historical Provider, MD  ciprofloxacin (CIPRO) 500 MG tablet  12/16/15   Historical Provider, MD  clonazePAM (KLONOPIN) 0.5 MG tablet Take 0.5 mg by mouth 2 (two) times daily as needed for anxiety.  01/21/15   Historical Provider, MD  cloNIDine (CATAPRES) 0.1 MG tablet Take 0.1 mg by mouth 3 (three) times daily.     Historical Provider, MD  COMBIVENT RESPIMAT 20-100 MCG/ACT AERS respimat inhale 2 puffs by mouth three times a day if needed for wheezing 08/22/15   Historical Provider, MD  folic acid (FOLVITE) 1 MG tablet Take 1 mg by mouth daily. 11/01/15 10/31/16  Historical Provider, MD  hydrALAZINE (APRESOLINE) 50 MG tablet Take 50 mg by mouth 3 (three) times daily.     Historical Provider, MD  levofloxacin (LEVAQUIN) 750 MG tablet take 1 tablet by mouth once daily for 7 days 11/28/15  Historical Provider, MD  magnesium oxide (MAG-OX) 400 (241.3 Mg) MG tablet Take 1 tablet by mouth daily. 09/30/15   Historical Provider, MD  magnesium oxide (MAG-OX) 400 MG tablet Take 400 mg by mouth 2 (two) times daily. 10/14/15 10/13/16  Historical Provider, MD  Multiple Vitamin (MULTI-VITAMINS) TABS Take 1 tablet by mouth daily. 11/01/15 10/31/16  Historical Provider, MD  potassium chloride SA (K-DUR,KLOR-CON) 20 MEQ tablet Take 20 mEq by mouth daily.    Historical Provider, MD  promethazine (PHENERGAN) 12.5 MG tablet take 1 tablet by mouth every 6 hours if needed for nausea 11/28/15   Historical  Provider, MD  propranolol (INDERAL) 40 MG tablet Take 40 mg by mouth 2 (two) times daily.  01/17/14   Historical Provider, MD  RA VITAMIN B-1 100 MG TABS  12/16/15   Historical Provider, MD  spironolactone (ALDACTONE) 50 MG tablet  12/16/15   Historical Provider, MD  thiamine 100 MG tablet Take 100 mg by mouth daily. 11/01/15 10/31/16  Historical Provider, MD  Vitamin D, Ergocalciferol, (DRISDOL) 50000 units CAPS capsule Take 50,000 Units by mouth every 7 (seven) days.  04/08/15   Historical Provider, MD    Allergies Ace inhibitors and Lisinopril  Family History  Problem Relation Age of Onset  . Cirrhosis Brother     Social History Social History  Substance Use Topics  . Smoking status: Current Every Day Smoker  . Smokeless tobacco: Not on file  . Alcohol use 60.0 oz/week    100 Standard drinks or equivalent per week    Review of Systems  Constitutional: Negative for fever. Cardiovascular: Negative for chest pain. Respiratory: Positive for shortness of breath. Gastrointestinal: Negative for abdominal pain, vomiting and diarrhea. Neurological: Negative for headaches, focal weakness or numbness.  10-point ROS otherwise negative.  ____________________________________________   PHYSICAL EXAM:  VITAL SIGNS: ED Triage Vitals [07/24/16 1404]  Enc Vitals Group     BP (!) 160/75     Pulse Rate 67     Resp (!) 24     Temp 98.7 F (37.1 C)     Temp Source Oral     SpO2 99 %     Weight 230 lb (104.3 kg)     Height 6\' 2"  (1.88 m)    Constitutional: Alert and oriented. Well appearing and in no distress. Eyes: Conjunctivae are normal. Normal extraocular movements. ENT   Head: Normocephalic and atraumatic.   Nose: No congestion/rhinnorhea.   Mouth/Throat: Mucous membranes are moist.   Neck: No stridor. Hematological/Lymphatic/Immunilogical: No cervical lymphadenopathy. Cardiovascular: Normal rate, regular rhythm.  No murmurs, rubs, or gallops.  Respiratory:  Normal respiratory effort without tachypnea nor retractions. Breath sounds are clear and equal bilaterally. No wheezes/rales/rhonchi. Gastrointestinal: Soft and non tender. No rebound. No guarding.  Genitourinary: Deferred Musculoskeletal: Normal range of motion in all extremities. No lower extremity edema. Neurologic:  Normal speech and language. No gross focal neurologic deficits are appreciated.  Skin:  Skin is warm, dry and intact. No rash noted. Psychiatric: Mood and affect are normal. Speech and behavior are normal. Patient exhibits appropriate insight and judgment.  ____________________________________________    LABS (pertinent positives/negatives)  Labs Reviewed  CBC WITH DIFFERENTIAL/PLATELET - Abnormal; Notable for the following:       Result Value   RBC 3.60 (*)    Hemoglobin 9.9 (*)    HCT 30.1 (*)    RDW 18.0 (*)    Platelets 20 (*)    All other components within normal limits  BASIC METABOLIC  PANEL - Abnormal; Notable for the following:    Sodium 132 (*)    CO2 20 (*)    Glucose, Bld 107 (*)    Calcium 8.1 (*)    All other components within normal limits  PROTIME-INR - Abnormal; Notable for the following:    Prothrombin Time 16.1 (*)    All other components within normal limits  APTT - Abnormal; Notable for the following:    aPTT 49 (*)    All other components within normal limits  CULTURE, BLOOD (ROUTINE X 2)  CULTURE, BLOOD (ROUTINE X 2)  DIRECT PLATELET ANTIBODY  PLATELET COUNT  PREPARE PLATELET PHERESIS     ____________________________________________   EKG  I, Phineas Semen, attending physician, personally viewed and interpreted this EKG  EKG Time: 1408 Rate: 66 Rhythm: normal sinus rhythm Axis: normal Intervals: qtc 452 QRS: narrow ST changes: no st elevation Impression: normal ekg   ____________________________________________    RADIOLOGY  CXR IMPRESSION:  Airspace disease in the right suprahilar region concerning for   infection given the history. Followup PA and lateral chest X-ray is  recommended in 3-4 weeks following trial of therapy to assure  resolution.     ____________________________________________   PROCEDURES  Procedures  ____________________________________________   INITIAL IMPRESSION / ASSESSMENT AND PLAN / ED COURSE  Pertinent labs & imaging results that were available during my care of the patient were reviewed by me and considered in my medical decision making (see chart for details).  Patient presented to the emergency department today with shortness of breath for 1 day. X-rays concerning for pneumonia. Blood work however does show that the patient has severe thrombocytopenia. Platelet count is 20. Baseline care everywhere patient has had low platelet  counts for a few months. Discussed with Dr. Orlie Dakin with heme/onc. Recommended platelet transfusion. Will plan on admission.  ____________________________________________   FINAL CLINICAL IMPRESSION(S) / ED DIAGNOSES  Final diagnoses:  Cirrhosis (HCC)  Large spleen     Note: This dictation was prepared with Dragon dictation. Any transcriptional errors that result from this process are unintentional     Phineas Semen, MD 07/24/16 1844

## 2016-07-24 NOTE — H&P (Signed)
[facility] - [place] [facility]   PATIENT NAME: Tavi Hoogendoorn    MR#:  [id number]  DATE OF BIRTH:  [birth year]  DATE OF ADMISSION:  07/24/2016  PRIMARY CARE PHYSICIAN: Rafael Bihari, MD   REQUESTING/REFERRING PHYSICIAN: Dr. Phineas Semen  CHIEF COMPLAINT:   Chief Complaint  Patient presents with  . Shortness of Breath    HISTORY OF PRESENT ILLNESS:  Tevon Berhane  is a 61 y.o. 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 Patient went to see his PCP 2 weeks ago for rectal bleed. His last colonoscopy was 2 years ago which showed only polyps, so he was discharged on Anusol rectal cream for possible hemorrhoidal bleed. He had an EGD in the past after diagnosis of cirrhosis which did not show any further races. Yesterday he felt pain in his right ear, felt dizzy, had sinus fullness. When he was coughing he noted tinge of blood in his sputum. Went to see a Advice worker, his ear was completely normal and so was sent home. He then didn't feel well, has shortness of breath and one more episode of bloody sputum and so presented to the emergency room. Also complained of diarrhea on admission, none now. He was noted to have a low platelet count of 20,000 and is being admitted for the same. No active bleeding at this time  PAST MEDICAL HISTORY:   Past Medical History:  Diagnosis Date  . Alcohol abuse   . Arthritis   . COPD with asthma (HCC)   . Essential tremor   . Gout   . Gout   . Hematuria   . HLD (hyperlipidemia)   . HTN (hypertension)     PAST SURGICAL HISTORY:   Past Surgical History:  Procedure Laterality Date  . broken nose    . HERNIA REPAIR      SOCIAL HISTORY:   Social History  Substance Use Topics  . Smoking status:  Current Every Day Smoker  . Smokeless tobacco: Not on file  . Alcohol use 60.0 oz/week    100 Standard drinks or equivalent per week    FAMILY HISTORY:   Family History  Problem Relation Age of Onset  . Cirrhosis Brother     DRUG ALLERGIES:   Allergies  Allergen Reactions  . Ace Inhibitors Swelling  . Lisinopril Swelling    REVIEW OF SYSTEMS:   Review of Systems  Constitutional: Positive for malaise/fatigue. Negative for chills, fever and weight loss.  HENT: Positive for ear pain. Negative for ear discharge, hearing loss, nosebleeds and tinnitus.   Eyes: Negative for blurred vision, double vision and photophobia.  Respiratory: Positive for hemoptysis and shortness of breath. Negative for cough and wheezing.   Cardiovascular: Negative for chest pain, palpitations, orthopnea and leg swelling.  Gastrointestinal: Positive for blood in stool. Negative for abdominal pain, constipation, diarrhea, heartburn, melena, nausea and vomiting.  Genitourinary: Negative for dysuria and urgency.  Musculoskeletal: Negative for myalgias and neck pain.  Skin: Negative for rash.  Neurological: Positive for dizziness. Negative for tingling, speech change, focal weakness and headaches.  Endo/Heme/Allergies: Does not bruise/bleed easily.  Psychiatric/Behavioral: Negative for depression.    MEDICATIONS AT HOME:   Prior to Admission medications   Medication Sig Start Date End Date Taking? Authorizing Provider  allopurinol (ZYLOPRIM) 100 MG tablet Take 100  mg by mouth 2 (two) times daily.    Yes Historical Provider, MD  Cholecalciferol (D3-50 PO) Take 50 mcg by mouth daily.   Yes Historical Provider, MD  ciprofloxacin (CIPRO) 500 MG tablet Take 500 mg by mouth daily.  12/16/15  Yes Historical Provider, MD  clonazePAM (KLONOPIN) 0.5 MG tablet Take 0.5 mg by mouth 2 (two) times daily as needed for anxiety.  01/21/15  Yes Historical Provider, MD  folic acid (FOLVITE) 1 MG tablet Take 1 mg by mouth  daily. 11/01/15 10/31/16 Yes Historical Provider, MD  omeprazole (PRILOSEC) 20 MG capsule Take 20 mg by mouth 2 (two) times daily. 07/22/16  Yes Historical Provider, MD  propranolol (INDERAL) 40 MG tablet Take 40 mg by mouth 2 (two) times daily.  01/17/14  Yes Historical Provider, MD  RA VITAMIN B-1 100 MG TABS Take 100 mg by mouth daily.  12/16/15  Yes Historical Provider, MD  spironolactone (ALDACTONE) 50 MG tablet Take 50 mg by mouth daily.  12/16/15  Yes Historical Provider, MD  thiamine 100 MG tablet Take 100 mg by mouth daily. 11/01/15 10/31/16  Historical Provider, MD      VITAL SIGNS:  Blood pressure (!) 163/84, pulse 66, temperature 98.7 F (37.1 C), temperature source Oral, resp. rate (!) 28, height 6\' 2"  (1.88 m), weight 104.3 kg (230 lb), SpO2 98 %.  PHYSICAL EXAMINATION:   Physical Exam  GENERAL:  61 y.o.-year-old patient lying in the bed with no acute distress. Speech is not very clear EYES: Pupils equal, round, reactive to light and accommodation. No scleral icterus. Extraocular muscles intact.  HEENT: Head atraumatic, normocephalic. Oropharynx and nasopharynx clear.  NECK:  Supple, no jugular venous distention. No thyroid enlargement, no tenderness.  LUNGS: Normal breath sounds bilaterally, no wheezing, rales,rhonchi or crepitation. No use of accessory muscles of respiration.  CARDIOVASCULAR: S1, S2 normal. No murmurs, rubs, or gallops.  ABDOMEN: Soft, nontender, nondistended. Bowel sounds present. No organomegaly or mass.  EXTREMITIES: No pedal edema, cyanosis, or clubbing.  NEUROLOGIC: Cranial nerves II through XII are intact. No nystagmus. Muscle strength 5/5 in all extremities. Sensation intact. Gait not checked.  PSYCHIATRIC: The patient is alert and oriented x 3.Mild cognitive deficit noted.  SKIN: No obvious rash, lesion, or ulcer.   LABORATORY PANEL:   CBC  Recent Labs Lab 07/24/16 1411  WBC 8.0  HGB 9.9*  HCT 30.1*  PLT 20*    ------------------------------------------------------------------------------------------------------------------  Chemistries   Recent Labs Lab 07/24/16 1411  NA 132*  K 3.5  CL 105  CO2 20*  GLUCOSE 107*  BUN 13  CREATININE 0.97  CALCIUM 8.1*   ------------------------------------------------------------------------------------------------------------------  Cardiac Enzymes No results for input(s): TROPONINI in the last 168 hours. ------------------------------------------------------------------------------------------------------------------  RADIOLOGY:  Dg Chest 2 View  Result Date: 07/24/2016 CLINICAL DATA:  61 year old male with a history of productive cough EXAM: CHEST  2 VIEW COMPARISON:  11/04/2015 FINDINGS: Cardiomediastinal silhouette unchanged. No pneumothorax.  No pleural effusion. Airspace disease extending from the right hilum superiorly in the suprahilar region. Coarsened interstitial markings again noted. No displaced fracture.  Degenerative changes of the spine. IMPRESSION: Airspace disease in the right suprahilar region concerning for infection given the history. Followup PA and lateral chest X-ray is recommended in 3-4 weeks following trial of therapy to assure resolution. Signed, Yvone Neu. Loreta Ave, DO [facility] Radiology Electronically Signed   By: Gilmer Mor D.O.   On: 07/24/2016 14:35    EKG:   Orders placed or performed  during the hospital encounter of 07/24/16  . ED EKG  . ED EKG    IMPRESSION AND PLAN:   Jettie BoozeGeorge Bistline  is a 61 y.o. 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 Noted to have platelet count of 20,000.   #1 Acute on chronic thrombocytopenia- baseline plt counts ranging from  28k- 38k range as outpatient in the last few months - no splenomegaly on exam. Having some hemoptysis, rectal bleed in the last 2 weeks - US abd for any splenomegaly, plt antibodies sent for - 1 unit pheresed platelets ordered for transfusion - hematology consult for the same. - Plt count after the transfusion to r/o autoimmune destruction  #2 Alcoholic liver cirrhosis- stopped alcohol in May 2017 - stable, no ascites now. Continue aldactone, propranolol - low sodium diet - Follows with GI at [facility]  #3 H/o SBP- s/p paracentesis and SBP- treatedi n past. Stable, no ascites on exam - continue prophylactic ciprofloxacin  #4 Tobacco use disorder-counseled, on nicotine patch  #5 DVT Prophylaxis- TEDs and SCDs. No heparin products due to thrombocytopenia  Physical Therapy consult at discharge   All the records are reviewed and case discussed with ED provider. Management plans discussed with the patient, family and they are in agreement.  CODE STATUS: Full Code  TOTAL TIME TAKING CARE OF THIS PATIENT: 55 minutes.    Enid BaasKALISETTI,Arick Mareno M.D on 07/24/2016 at 6:08 PM  Between 7am to 6pm - Pager - [phone]  After 6pm go to [email or web address] - Social research officer, governmentpassword EPAS [email or web address]  [facility]  Office  [phone]  CC: Primary care physician; Rafael BihariWALKER III, JOHN B, MD

## 2016-07-25 ENCOUNTER — Observation Stay: Payer: BLUE CROSS/BLUE SHIELD

## 2016-07-25 DIAGNOSIS — R413 Other amnesia: Secondary | ICD-10-CM

## 2016-07-25 DIAGNOSIS — K7030 Alcoholic cirrhosis of liver without ascites: Secondary | ICD-10-CM

## 2016-07-25 DIAGNOSIS — D696 Thrombocytopenia, unspecified: Secondary | ICD-10-CM | POA: Diagnosis not present

## 2016-07-25 DIAGNOSIS — M129 Arthropathy, unspecified: Secondary | ICD-10-CM

## 2016-07-25 DIAGNOSIS — I10 Essential (primary) hypertension: Secondary | ICD-10-CM

## 2016-07-25 DIAGNOSIS — J449 Chronic obstructive pulmonary disease, unspecified: Secondary | ICD-10-CM | POA: Diagnosis not present

## 2016-07-25 DIAGNOSIS — E785 Hyperlipidemia, unspecified: Secondary | ICD-10-CM

## 2016-07-25 DIAGNOSIS — R0602 Shortness of breath: Secondary | ICD-10-CM | POA: Diagnosis not present

## 2016-07-25 DIAGNOSIS — F1010 Alcohol abuse, uncomplicated: Secondary | ICD-10-CM | POA: Diagnosis not present

## 2016-07-25 DIAGNOSIS — R042 Hemoptysis: Secondary | ICD-10-CM

## 2016-07-25 DIAGNOSIS — F17210 Nicotine dependence, cigarettes, uncomplicated: Secondary | ICD-10-CM

## 2016-07-25 DIAGNOSIS — M109 Gout, unspecified: Secondary | ICD-10-CM

## 2016-07-25 DIAGNOSIS — R251 Tremor, unspecified: Secondary | ICD-10-CM

## 2016-07-25 DIAGNOSIS — Z79899 Other long term (current) drug therapy: Secondary | ICD-10-CM

## 2016-07-25 LAB — CBC
HCT: 29.8 % — ABNORMAL LOW (ref 40.0–52.0)
Hemoglobin: 10.2 g/dL — ABNORMAL LOW (ref 13.0–18.0)
MCH: 28.2 pg (ref 26.0–34.0)
MCHC: 34.3 g/dL (ref 32.0–36.0)
MCV: 82.3 fL (ref 80.0–100.0)
PLATELETS: 28 10*3/uL — AB (ref 150–440)
RBC: 3.62 MIL/uL — AB (ref 4.40–5.90)
RDW: 18.1 % — ABNORMAL HIGH (ref 11.5–14.5)
WBC: 8.3 10*3/uL (ref 3.8–10.6)

## 2016-07-25 LAB — BASIC METABOLIC PANEL
ANION GAP: 6 (ref 5–15)
BUN: 13 mg/dL (ref 6–20)
CALCIUM: 8.4 mg/dL — AB (ref 8.9–10.3)
CO2: 21 mmol/L — ABNORMAL LOW (ref 22–32)
CREATININE: 1.01 mg/dL (ref 0.61–1.24)
Chloride: 106 mmol/L (ref 101–111)
GFR calc non Af Amer: >60 mL/min (ref >60)
Glucose, Bld: 103 mg/dL — ABNORMAL HIGH (ref 65–99)
Potassium: 3.8 mmol/L (ref 3.5–5.1)
Sodium: 133 mmol/L — ABNORMAL LOW (ref 135–145)

## 2016-07-25 LAB — PREPARE PLATELET PHERESIS: UNIT DIVISION: 0

## 2016-07-25 MED ORDER — NICOTINE 14 MG/24HR TD PT24: 14.0000 mg | MEDICATED_PATCH | Freq: Every day | TRANSDERMAL | 0 refills | Status: DC

## 2016-07-25 MED ORDER — HYDRALAZINE HCL 25 MG PO TABS
25.0000 mg | ORAL_TABLET | Freq: Once | ORAL | Status: AC
  Administered 2016-07-25: 25 mg via ORAL
  Filled 2016-07-25: qty 1

## 2016-07-25 MED ORDER — AMLODIPINE BESYLATE 5 MG PO TABS: 5.0000 mg | ORAL_TABLET | Freq: Every day | ORAL | 0 refills | Status: DC

## 2016-07-25 NOTE — Consult Note (Signed)
[place]  Telephone:(336) [phone] Fax:([phone]) [phone]  ID: Florene Route III OB: [birth year]  MR#: [id number]  VHQ#:[id number]  Patient Care Team: Madelyn Brunner, MD as PCP - General (Internal Medicine)  CHIEF COMPLAINT: Thrombocytopenia, cirrhosis.   INTERVAL HISTORY: Patient is a 61 year old male with a known history of alcoholic liver cirrhosis and chronic thrombocytopenia who presented to the emergency room with increasing shortness of breath and hemoptysis. He is found to have a platelet count of 20,000 and admitted for observation. Patient is a poor historian and review of systems is difficult. He denies any further hemoptysis. He complained of diarrhea on admission, but denies any currently. He has no neurologic complaints. He denies any recent fevers. He does not complain of shortness of breath or chest pain today. He has no nausea, vomiting, or constipation. Report he had a mild rectal bleed several weeks ago, but otherwise does not complain of easy bleeding or bruising. Patient offers no further specific complaints today.  REVIEW OF SYSTEMS:   Review of Systems  Constitutional: Negative.  Negative for fever, malaise/fatigue and weight loss.  Respiratory: Positive for hemoptysis. Negative for cough and shortness of breath.   Cardiovascular: Negative.  Negative for chest pain and leg swelling.  Gastrointestinal: Negative for abdominal pain, blood in stool and melena.  Musculoskeletal: Negative.   Neurological: Negative.  Negative for weakness.  Psychiatric/Behavioral: Positive for memory loss.    As per HPI. Otherwise, a complete review of systems is negative.  PAST MEDICAL HISTORY: Past Medical History:  Diagnosis Date  . Alcohol abuse   . Arthritis   . COPD with asthma ([place])   . Essential tremor   . Gout   . Gout   . Hematuria   . HLD (hyperlipidemia)   . HTN (hypertension)     PAST SURGICAL HISTORY: Past Surgical History:  Procedure  Laterality Date  . broken nose    . HERNIA REPAIR      FAMILY HISTORY: Family History  Problem Relation Age of Onset  . Cirrhosis Brother     ADVANCED DIRECTIVES (Y/N):  @ADVDIR @  HEALTH MAINTENANCE: Social History  Substance Use Topics  . Smoking status: Current Every Day Smoker  . Smokeless tobacco: Not on file  . Alcohol use 60.0 oz/week    100 Standard drinks or equivalent per week     Colonoscopy:  PAP:  Bone density:  Lipid panel:  Allergies  Allergen Reactions  . Ace Inhibitors Swelling  . Lisinopril Swelling    Current Facility-Administered Medications  Medication Dose Route Frequency Provider Last Rate Last Dose  . 0.9 %  sodium chloride infusion  10 mL/hr Intravenous Once Nance Pear, MD      . acetaminophen (TYLENOL) tablet 650 mg  650 mg Oral Q6H PRN Gladstone Lighter, MD       Or  . acetaminophen (TYLENOL) suppository 650 mg  650 mg Rectal Q6H PRN Gladstone Lighter, MD      . allopurinol (ZYLOPRIM) tablet 100 mg  100 mg Oral BID Gladstone Lighter, MD   100 mg at 07/25/16 0826  . ciprofloxacin (CIPRO) tablet 500 mg  500 mg Oral Daily Gladstone Lighter, MD   500 mg at 07/25/16 0826  . clonazePAM (KLONOPIN) tablet 0.5 mg  0.5 mg Oral BID PRN Gladstone Lighter, MD      . folic acid (FOLVITE) tablet 1 mg  1 mg Oral Daily Gladstone Lighter, MD   1 mg at 07/25/16 0826  . nicotine (NICODERM CQ -  dosed in mg/24 hours) patch 14 mg  14 mg Transdermal Daily Gladstone Lighter, MD      . ondansetron (ZOFRAN) tablet 4 mg  4 mg Oral Q6H PRN Gladstone Lighter, MD       Or  . ondansetron (ZOFRAN) injection 4 mg  4 mg Intravenous Q6H PRN Gladstone Lighter, MD      . pantoprazole (PROTONIX) EC tablet 40 mg  40 mg Oral BID Gladstone Lighter, MD   40 mg at 07/25/16 3976  . propranolol (INDERAL) tablet 40 mg  40 mg Oral BID Gladstone Lighter, MD   40 mg at 07/25/16 0827  . sodium chloride flush (NS) 0.9 % injection 3 mL  3 mL Intravenous Q12H Gladstone Lighter, MD   3 mL at  07/25/16 0834  . spironolactone (ALDACTONE) tablet 50 mg  50 mg Oral Daily Gladstone Lighter, MD   50 mg at 07/25/16 7341  . thiamine tablet 100 mg  100 mg Oral Daily Gladstone Lighter, MD   100 mg at 07/24/16 2102    OBJECTIVE: Vitals:   07/25/16 0804 07/25/16 0824  BP: (!) 163/111 (!) 164/60  Pulse: 75   Resp: 20   Temp: 99.8 F (37.7 C)      Body mass index is 29.53 kg/m.    ECOG FS:1 - Symptomatic but completely ambulatory  General: Well-developed, well-nourished, no acute distress. Eyes: Pink conjunctiva, anicteric sclera. HEENT: Normocephalic, moist mucous membranes, clear oropharnyx. Lungs: Clear to auscultation bilaterally. Heart: Regular rate and rhythm. No rubs, murmurs, or gallops. Abdomen: Soft, nontender, nondistended. No organomegaly noted, normoactive bowel sounds. Musculoskeletal: No edema, cyanosis, or clubbing. Neuro: Alert, answering all questions appropriately. Cranial nerves grossly intact. Skin: No rashes or petechiae noted. Psych: Normal affect. Lymphatics: No cervical, calvicular, axillary or inguinal LAD.   LAB RESULTS:  Lab Results  Component Value Date   NA 133 (L) 07/25/2016   K 3.8 07/25/2016   CL 106 07/25/2016   CO2 21 (L) 07/25/2016   GLUCOSE 103 (H) 07/25/2016   BUN 13 07/25/2016   CREATININE 1.01 07/25/2016   CALCIUM 8.4 (L) 07/25/2016   PROT 7.2 11/06/2015   ALBUMIN 2.2 (L) 11/06/2015   AST 199 (H) 11/06/2015   ALT 27 11/06/2015   ALKPHOS 158 (H) 11/06/2015   BILITOT 6.4 (H) 11/06/2015   GFRNONAA >60 07/25/2016   GFRAA >60 07/25/2016    Lab Results  Component Value Date   WBC 8.3 07/25/2016   NEUTROABS 4.7 07/24/2016   HGB 10.2 (L) 07/25/2016   HCT 29.8 (L) 07/25/2016   MCV 82.3 07/25/2016   PLT 28 (LL) 07/25/2016     STUDIES: Dg Chest 2 View  Result Date: 07/24/2016 CLINICAL DATA:  61 year old male with a history of productive cough EXAM: CHEST  2 VIEW COMPARISON:  11/04/2015 FINDINGS: Cardiomediastinal silhouette  unchanged. No pneumothorax.  No pleural effusion. Airspace disease extending from the right hilum superiorly in the suprahilar region. Coarsened interstitial markings again noted. No displaced fracture.  Degenerative changes of the spine. IMPRESSION: Airspace disease in the right suprahilar region concerning for infection given the history. Followup PA and lateral chest X-ray is recommended in 3-4 weeks following trial of therapy to assure resolution. Signed, Dulcy Fanny. Earleen Newport, DO [facility] Radiology Electronically Signed   By: Corrie Mckusick D.O.   On: 07/24/2016 14:35    ASSESSMENT: Thrombocytopenia, cirrhosis.   PLAN:    1. Thrombocytopenia: Chronic. Patient's baseline appears to be about 30,000 for the past 6-8  months. This is likely secondary to his ongoing cirrhosis. Agree with splenic ultrasound to assess for splenomegaly. Patient's platelet count improved to 28,000 with transfusion. He does not require additional platelets at this time. Patient does not appear to have DIC given his normal INR. PT and PTT are mildly elevated likely secondary to cirrhosis. Will order platelet antibodies for completeness. Patient does not require bone marrow biopsy at this time. No further intervention is needed. Please insure patient has follow-up in the [place] 2-3 weeks after discharge for repeat laboratory work and further evaluation.   Appreciate consult, call with questions.   Lloyd Huger, MD   07/25/2016 10:49 AM

## 2016-07-25 NOTE — Care Management Obs Status (Signed)
MEDICARE OBSERVATION STATUS NOTIFICATION   Patient Details  Name: Raymond Erickson MRN: [id number] Date of Birth: 1956/02/01   Medicare Observation Status Notification Given:  No (No MOON letter given per D/C within 24 hours of admission. )    Linwood Gullikson A, RN 07/25/2016, 3:57 PM

## 2016-07-25 NOTE — Care Management Note (Signed)
Case Management Note  Patient Details  Name: Raymond Erickson MRN: [id number] Date of Birth: 1955/10/04  Subjective/Objective:       Discussed discharge planning with [name]. Provided insured [name] with 2 medication discount cards, and advised him to call his PCP's office on Monday to request a referral to a local PT outpatient provider.              Action/Plan:   Expected Discharge Date:  07/25/16               Expected Discharge Plan:     In-House Referral:     Discharge planning Services     Post Acute Care Choice:    Choice offered to:     DME Arranged:    DME Agency:     HH Arranged:    HH Agency:     Status of Service:     If discussed at [employer] Meetings, dates discussed:    Additional Comments:  Nasiyah Laverdiere A, RN 07/25/2016, 1:46 PM

## 2016-07-25 NOTE — Discharge Summary (Signed)
[facility] [place] [facility]   PATIENT NAME: Raymond Erickson    MR#:  [id number]  DATE OF BIRTH:  1955/08/05  DATE OF ADMISSION:  07/24/2016 ADMITTING PHYSICIAN: Enid Baas, MD  DATE OF DISCHARGE:  07/25/16 PRIMARY CARE PHYSICIAN: Rafael Bihari, MD    ADMISSION DIAGNOSIS:  Cirrhosis (HCC) [K74.60] Large spleen [R16.1]  DISCHARGE DIAGNOSIS:  Active Problems:   Thrombocytopenia (HCC) Tobacco abuse disorder  SECONDARY DIAGNOSIS:   Past Medical History:  Diagnosis Date  . Alcohol abuse   . Arthritis   . COPD with asthma (HCC)   . Essential tremor   . Gout   . Gout   . Hematuria   . HLD (hyperlipidemia)   . HTN (hypertension)     HOSPITAL COURSE:  History of present illness: Raymond Erickson  is a 61 y.o. 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 Patient went to see his PCP 2 weeks ago for rectal bleed. His last colonoscopy was 2 years ago which showed only polyps, so he was discharged on Anusol rectal cream for possible hemorrhoidal bleed. He had an EGD in the past after diagnosis of cirrhosis which did not show any further races. Yesterday he felt pain in his right ear, felt dizzy, had sinus fullness. When he was coughing he noted tinge of blood in his sputum. Went to see a Advice worker, his ear was completely normal and so was sent home. He then didn't feel well, has shortness of breath and one more episode of bloody sputum and so presented to the emergency room. Also complained of diarrhea on admission, none now. He was noted to have a low platelet count of 20,000 and is being admitted for the same. No active bleeding at this time  Hospital course  1 Acute on chronic thrombocytopenia- baseline plt counts ranging from  28k- 38k range as outpatient in the last few months - no splenomegaly on exam. Having some hemoptysis, rectal bleed in the last 2 weeks - [place] abd reveals borderline splenomegaly, plt antibodies sent out - 1 unit pheresed platelets transfused - hematology -patient was seen by Dr. Orlie Dakin his thrombocytopenia seems to be chronic baseline platelet count is at around 30,000 for the past 6-8 months secondary to ongoing cirrhosis. Platelet count improved to 28,000 after transfusion. No additional platelet transfusion is needed at this time. DIC ruled out. Not recommending any other interventions and patient is to follow-up with Dr. Orlie Dakin at [facility] in 2-3 weeks after discharge for further evaluation   #2 Alcoholic liver cirrhosis- stopped alcohol in May 2017 - stable, no ascites now. Continue aldactone, propranolol - low sodium diet - Follows with GI at [facility]  #3 H/o SBP- s/p paracentesis and SBP- treatedi n past. Stable, no ascites on exam - continue prophylactic ciprofloxacin  #4 Tobacco use disorder-counseled, on nicotine patch  #Essential hypertension elevated blood pressure start him on amlodipine 5 mg by mouth daily , PCP to titrate as needed  #5 DVT Prophylaxis- TEDs and SCDs. No heparin products due to thrombocytopenia  Physical Therapy -recommended outpatient physical therapy   DISCHARGE CONDITIONS:   Stable  CONSULTS OBTAINED:  Treatment Team:  Jeralyn Ruths, MD   PROCEDURES Platelet transfusion  DRUG ALLERGIES:   Allergies  Allergen Reactions  . Ace Inhibitors Swelling  . Lisinopril Swelling  DISCHARGE MEDICATIONS:   Current Discharge Medication List    START taking these medications   Details  amLODipine (NORVASC) 5 MG tablet Take 1 tablet (5 mg total) by mouth daily. Qty: 30 tablet, Refills: 0    nicotine (NICODERM CQ - DOSED IN MG/24 HOURS) 14 mg/24hr patch Place 1 patch (14 mg total) onto the skin daily. Qty: 28 patch, Refills: 0       CONTINUE these medications which have NOT CHANGED   Details  allopurinol (ZYLOPRIM) 100 MG tablet Take 100 mg by mouth 2 (two) times daily.     Cholecalciferol (D3-50 PO) Take 50 mcg by mouth daily.    ciprofloxacin (CIPRO) 500 MG tablet Take 500 mg by mouth daily.  Refills: 1    clonazePAM (KLONOPIN) 0.5 MG tablet Take 0.5 mg by mouth 2 (two) times daily as needed for anxiety.     folic acid (FOLVITE) 1 MG tablet Take 1 mg by mouth daily.    omeprazole (PRILOSEC) 20 MG capsule Take 20 mg by mouth 2 (two) times daily. Refills: 1    propranolol (INDERAL) 40 MG tablet Take 40 mg by mouth 2 (two) times daily.     RA VITAMIN B-1 100 MG TABS Take 100 mg by mouth daily.  Refills: 1    spironolactone (ALDACTONE) 50 MG tablet Take 50 mg by mouth daily.  Refills: 1    thiamine 100 MG tablet Take 100 mg by mouth daily.         DISCHARGE INSTRUCTIONS:   Follow-up with primary care physician in a week Follow-up with oncology Dr. Orlie Dakin in 1-2 weeks Continued follow-up with [facility] as recommended by them  DIET:  Low salt  DISCHARGE CONDITION:  Stable  ACTIVITY:  Activity as tolerated  OXYGEN:  Home Oxygen: No.   Oxygen Delivery: room air  DISCHARGE LOCATION:  home   If you experience worsening of your admission symptoms, develop shortness of breath, life threatening emergency, suicidal or homicidal thoughts you must seek medical attention immediately by calling 911 or calling your MD immediately  if symptoms less severe.  You Must read complete instructions/literature along with all the possible adverse reactions/side effects for all the Medicines you take and that have been prescribed to you. Take any new Medicines after you have completely understood and accpet all the possible adverse reactions/side effects.   Please note  You were cared for by a hospitalist during your hospital stay. If you have any questions about your discharge medications or the care you  received while you were in the hospital after you are discharged, you can call the unit and asked to speak with the hospitalist on call if the hospitalist that took care of you is not available. Once you are discharged, your primary care physician will handle any further medical issues. Please note that NO REFILLS for any discharge medications will be authorized once you are discharged, as it is imperative that you return to your primary care physician (or establish a relationship with a primary care physician if you do not have one) for your aftercare needs so that they can reassess your need for medications and monitor your lab values.     Today  Chief Complaint  Patient presents with  . Shortness of Breath   Patient is doing fine. Denies any abdominal pain. Denies any shortness of breath  ROS:  CONSTITUTIONAL: Denies fevers, chills. Denies any fatigue, weakness.  EYES: Denies blurry vision, double vision, eye pain. EARS, NOSE, THROAT:  Denies tinnitus, ear pain, hearing loss. RESPIRATORY: Denies cough, wheeze, shortness of breath.  CARDIOVASCULAR: Denies chest pain, palpitations, edema.  GASTROINTESTINAL: Denies nausea, vomiting, diarrhea, abdominal pain. Denies bright red blood per rectum. GENITOURINARY: Denies dysuria, hematuria. ENDOCRINE: Denies nocturia or thyroid problems. HEMATOLOGIC AND LYMPHATIC: Denies easy bruising or bleeding. SKIN: Denies rash or lesion. MUSCULOSKELETAL: Denies pain in neck, back, shoulder, knees, hips or arthritic symptoms.  NEUROLOGIC: Denies paralysis, paresthesias.  PSYCHIATRIC: Denies anxiety or depressive symptoms.   VITAL SIGNS:  Blood pressure (!) 163/68, pulse 75, temperature 98.3 F (36.8 C), temperature source Oral, resp. rate 16, height 6\' 2"  (1.88 m), weight 104.3 kg (230 lb), SpO2 97 %.  I/O:    Intake/Output Summary (Last 24 hours) at 07/25/16 1417 Last data filed at 07/24/16 2247  Gross per 24 hour  Intake              202 ml   Output                0 ml  Net              202 ml    PHYSICAL EXAMINATION:  GENERAL:  61 y.o.-year-old patient lying in the bed with no acute distress.  EYES: Pupils equal, round, reactive to light and accommodation. No scleral icterus. Extraocular muscles intact.  HEENT: Head atraumatic, normocephalic. Oropharynx and nasopharynx clear.  NECK:  Supple, no jugular venous distention. No thyroid enlargement, no tenderness.  LUNGS: Normal breath sounds bilaterally, no wheezing, rales,rhonchi or crepitation. No use of accessory muscles of respiration.  CARDIOVASCULAR: S1, S2 normal. No murmurs, rubs, or gallops.  ABDOMEN: Soft, non-tender, non-distended. Bowel sounds present. No organomegaly or mass.  EXTREMITIES: No pedal edema, cyanosis, or clubbing.  NEUROLOGIC: Cranial nerves II through XII are intact. Muscle strength 5/5 in all extremities. Sensation intact. Gait not checked.  PSYCHIATRIC: The patient is alert and oriented x 3.  SKIN: No obvious rash, lesion, or ulcer.   DATA REVIEW:   CBC  Recent Labs Lab 07/25/16 0322  WBC 8.3  HGB 10.2*  HCT 29.8*  PLT 28*    Chemistries   Recent Labs Lab 07/25/16 0322  NA 133*  K 3.8  CL 106  CO2 21*  GLUCOSE 103*  BUN 13  CREATININE 1.01  CALCIUM 8.4*    Cardiac Enzymes No results for input(s): TROPONINI in the last 168 hours.  Microbiology Results  Results for orders placed or performed during the hospital encounter of 07/24/16  Blood culture (routine x 2)     Status: None (Preliminary result)   Collection Time: 07/24/16  5:34 PM  Result Value Ref Range Status   Specimen Description BLOOD RIGHT HAND  Final   Special Requests BOTTLES DRAWN AEROBIC AND ANAEROBIC BCAV  Final   Culture NO GROWTH < 24 HOURS  Final   Report Status PENDING  Incomplete  Blood culture (routine x 2)     Status: None (Preliminary result)   Collection Time: 07/24/16  5:35 PM  Result Value Ref Range Status   Specimen Description BLOOD LEFT  FOREARM  Final   Special Requests BOTTLES DRAWN AEROBIC AND ANAEROBIC BCAV  Final   Culture NO GROWTH < 24 HOURS  Final   Report Status PENDING  Incomplete    RADIOLOGY:  Dg Chest 2 View  Result Date: 07/24/2016 CLINICAL DATA:  61 year old male with a history of productive cough EXAM: CHEST  2 VIEW COMPARISON:  11/04/2015 FINDINGS: Cardiomediastinal silhouette unchanged. No pneumothorax.  No pleural effusion. Airspace disease extending from the right hilum superiorly in the suprahilar region. Coarsened interstitial markings again noted. No displaced fracture.  Degenerative changes of the spine. IMPRESSION: Airspace disease in the right suprahilar region concerning for infection given the history. Followup PA and lateral chest X-ray is recommended in 3-4 weeks following trial of therapy to assure resolution. Signed, Yvone NeuJaime S. Loreta AveWagner, DO [facility] Electronically Signed   By: Gilmer MorJaime  Wagner D.O.   On: 07/24/2016 14:35   [place] Abdomen Limited  Result Date: 07/25/2016 CLINICAL DATA:  History of cirrhosis.  Evaluate for enlarged spleen. EXAM: LIMITED ABDOMINAL ULTRASOUND COMPARISON:  CT, 11/05/2015 FINDINGS: Spleen is borderline enlarged measuring a maximum single dimension of 12.3 cm and a volume of 515 mL. Spleen has increased in size when compared to the prior CT. No splenic mass or focal lesion. IMPRESSION: 1. Borderline splenomegaly. Spleen increased in size when compared to the prior abdomen and pelvis CT. Electronically Signed   By: Amie Portlandavid  Ormond M.D.   On: 07/25/2016 11:57    EKG:   Orders placed or performed during the hospital encounter of 07/24/16  . ED EKG  . ED EKG      Management plans discussed with the patient, family and they are in agreement.  CODE STATUS:     Code Status Orders        Start     Ordered   07/24/16 1917  Full code  Continuous     07/24/16 1916    Code Status History    Date Active Date Inactive  Code Status Order ID Comments User Context   11/05/2015  5:24 AM 11/09/2015 12:03 PM Full Code [id number]  Coralyn HellingVineet Sood, MD Inpatient      TOTAL TIME TAKING CARE OF THIS PATIENT: 45 minutes.   Note: This dictation was prepared with Dragon dictation along with smaller phrase technology. Any transcriptional errors that result from this process are unintentional.   @[facility] @  on 07/25/2016 at 2:17 PM  Between 7am to 6pm - Pager - [phone]  After 6pm go to [email or web address] - password [email or web address] [facility]  [facility]  Office  [phone]  CC: Primary care physician; Rafael BihariWALKER III, JOHN B, MD

## 2016-07-25 NOTE — Discharge Instructions (Signed)
Follow-up with primary care physician in a week Follow-up with oncology Dr. Orlie DakinFinnegan in 1-2 weeks Continued follow-up with [facility] as recommended by them

## 2016-07-25 NOTE — Evaluation (Signed)
Physical Therapy Evaluation Patient Details Name: Raymond Erickson MRN: [id number] DOB: [birth year] Today's Date: 07/25/2016   History of Present Illness  61 yo male with onset of thrombocytopenia, complaints of SOB and cough with exertion.  PMHx:  cirrhosis, COPD, smoker and CKD  Clinical Impression  Pt demonstrates a need for monitoring his O2 sats due to complaints of SOB.  Noted pregait sat 97% with pulse 80, post gait 97% pulse 81 and mid gait pre stairs 95% pulse 88.  He is mainly SOB at times and need for follow up therapy is primarily to assist him with establishing a walking/endurance exercise program that he can follow with his chronic lung issues.  Pt is motivated and asking for help for funding BP meds, very interested in health maintenance to avoid future issues.  Follow acutely to see if he wants to climb a larger flight of steps next visit and if not then can discharge from PT.    Follow Up Recommendations Outpatient PT;Other (comment) (maybe cardiac rehab to increase endurance efforts)    Equipment Recommendations  None recommended by PT    Recommendations for Other Services       Precautions / Restrictions Precautions Precautions: None Restrictions Weight Bearing Restrictions: No      Mobility  Bed Mobility Overal bed mobility: Modified Independent             General bed mobility comments: extra time to sit up but indep to return to bed  Transfers Overall transfer level: Modified independent Equipment used: None                Ambulation/Gait Ambulation/Gait assistance: Supervision Ambulation Distance (Feet): 350 Feet Assistive device: None Gait Pattern/deviations: Step-through pattern;Wide base of support;Drifts right/left Gait velocity: normal Gait velocity interpretation: at or above normal speed for age/gender General Gait Details: able to navigate with no help but occas steps that are off but mainly due to stiff ankles  Stairs Stairs:  Yes Stairs assistance: Supervision;Min guard (for safety) Stair Management: Two rails;Forwards Number of Stairs: 4 General stair comments: went up and down just to see as 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)       Balance Overall balance assessment: No apparent balance deficits (not formally assessed)                                           Pertinent Vitals/Pain Pain Assessment: No/denies pain    Home Living Family/patient expects to be discharged to:: Private residence Living Arrangements: Spouse/significant other Available Help at Discharge: Family;Available PRN/intermittently Type of Home: House Home Access: Level entry     Home Layout: One level Home Equipment: None      Prior Function Level of Independence: Independent         Comments: 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     Higher education careers adviser        Extremity/Trunk Assessment   Upper Extremity Assessment Upper Extremity Assessment: Overall WFL for tasks assessed    Lower Extremity Assessment Lower Extremity Assessment: Overall WFL for tasks assessed    Cervical / Trunk Assessment Cervical / Trunk Assessment: Normal  Communication   Communication: No difficulties  Cognition Arousal/Alertness: Awake/alert Behavior During Therapy: WFL for tasks assessed/performed Overall Cognitive Status: Within Functional Limits for  tasks assessed                      General Comments      Exercises     Assessment/Plan    PT Assessment Patient needs continued PT services  PT Problem List Cardiopulmonary status limiting activity          PT Treatment Interventions DME instruction;Gait training;Stair training;Balance training    PT Goals (Current goals can be found in the Care Plan section)  Acute Rehab PT Goals Patient Stated Goal: to get BP meds worked out, has limited money to  buy them PT Goal Formulation: With patient/family Time For Goal Achievement: 08/01/16 Potential to Achieve Goals: Good    Frequency Min 2X/week   Barriers to discharge   no home discharge needs    Co-evaluation               End of Session Equipment Utilized During Treatment: Gait belt Activity Tolerance: Patient tolerated treatment well Patient left: in bed;with call bell/phone within reach;with nursing/sitter in room;with family/visitor present Nurse Communication: Mobility status    Functional Assessment Tool Used: clinical judgment Functional Limitation: Mobility: Walking and moving around Mobility: Walking and Moving Around Current Status [phone](G8978): At least 1 percent but less than 20 percent impaired, limited or restricted Mobility: Walking and Moving Around Goal Status [phone](G8979): 0 percent impaired, limited or restricted    Time: 1021-1054 PT Time Calculation (min) (ACUTE ONLY): 33 min   Charges:   PT Evaluation $PT Eval Moderate Complexity: 1 Procedure PT Treatments $Gait Training: 8-22 mins   PT G Codes:   PT G-Codes **NOT FOR INPATIENT CLASS** Functional Assessment Tool Used: clinical judgment Functional Limitation: Mobility: Walking and moving around Mobility: Walking and Moving Around Current Status ([id number](G8978): At least 1 percent but less than 20 percent impaired, limited or restricted Mobility: Walking and Moving Around Goal Status [phone](G8979): 0 percent impaired, limited or restricted    Ivar DrapeRuth E Asmar Brozek 07/25/2016, 11:18 AM   Samul Dadauth Mandell Pangborn, PT MS Acute Rehab Dept. Number: [facility] [id number] and [facility] [phone]

## 2016-07-25 NOTE — Progress Notes (Signed)
Pt discharged home, explained discharged summary. Signed and placed inchart.alert and orient x 3, able to make needs known. Prescription given to patient.

## 2016-07-26 LAB — HIV ANTIBODY (ROUTINE TESTING W REFLEX): HIV Screen 4th Generation wRfx: NONREACTIVE

## 2016-07-26 LAB — DIRECT PLATELET ANTIBODY
PLT ASSOC. ANTI-IB/IX: NEGATIVE
PLT ASSOC. ANTI-IIB/IIIA: POSITIVE — AB
Plt Assoc. Anti-IA/IIA: NEGATIVE

## 2016-07-27 LAB — DIRECT PLATELET ANTIBODY
PLT ASSOC. ANTI-IIB/IIIA: POSITIVE — AB
Plt Assoc. Anti-IA/IIA: POSITIVE — AB
Plt Assoc. Anti-IB/IX: NEGATIVE

## 2016-07-29 LAB — CULTURE, BLOOD (ROUTINE X 2)
Culture: NO GROWTH
Culture: NO GROWTH

## 2017-08-08 IMAGING — CT CT ABD-PELV W/ CM
2 of 5 series · 15 of 46 positions shown, 17 images · IV contrast (iopamidol)
Comparison: CT 09/12/2015

CLINICAL DATA: Lower abdominal pain. Midline abdominal pain and
hypotension.

EXAM:
CT ABDOMEN AND PELVIS WITH CONTRAST
TECHNIQUE: Multidetector CT imaging of the abdomen and pelvis was performed
using the standard protocol following bolus administration of
intravenous contrast.
CONTRAST:  100mL 0OUO34-6YY IOPAMIDOL (0OUO34-6YY) INJECTION 61%

[Series 2: routine abd pel with · axial · 0.75mm/px · z∈[-1028,-594]mm · 12 of 99 slices shown, 14 images]
[im 6/99  soft-tissue]
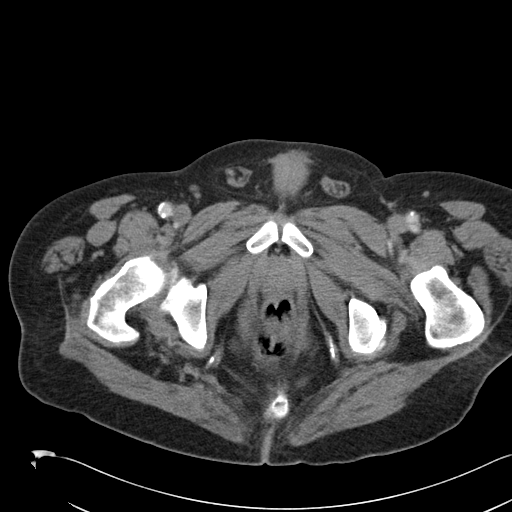
[im 6/99  bone]
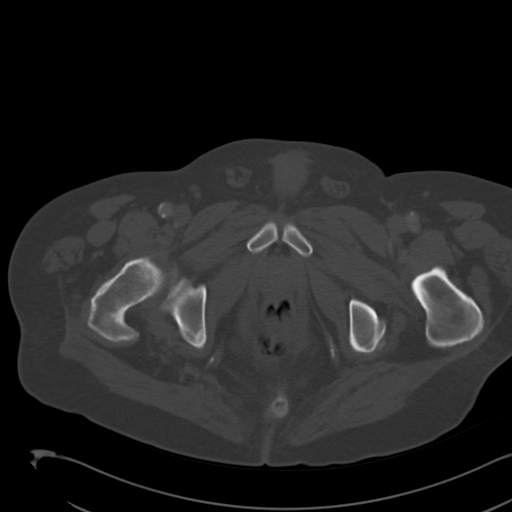
[im 17/99  soft-tissue]
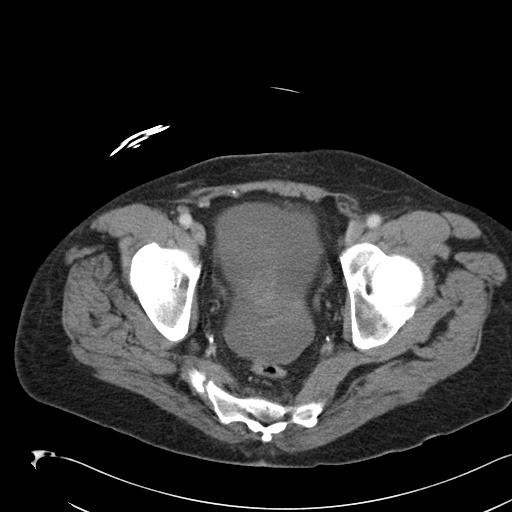
[im 22/99  soft-tissue]
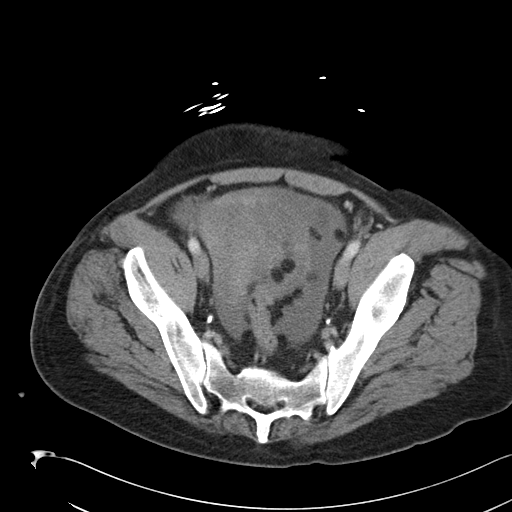
[im 28/99  soft-tissue]
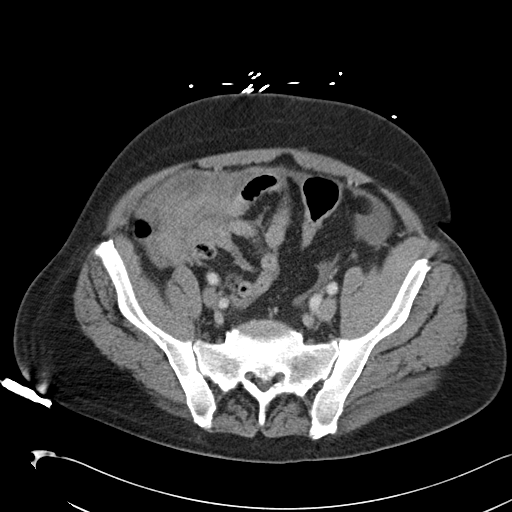
[im 39/99  soft-tissue]
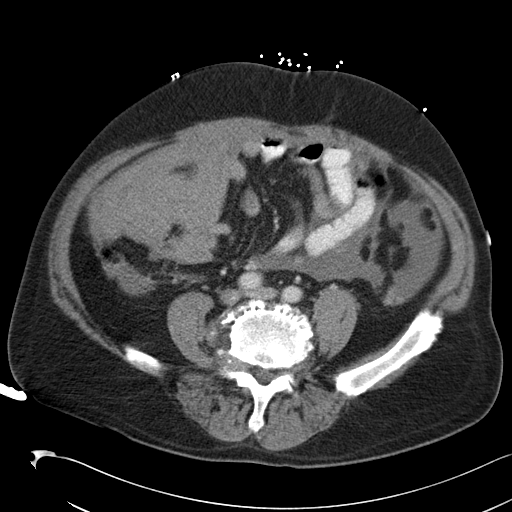
[im 44/99  soft-tissue]
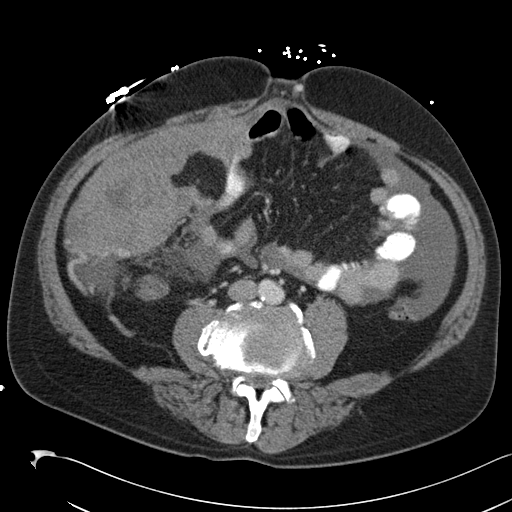
[im 55/99  soft-tissue]
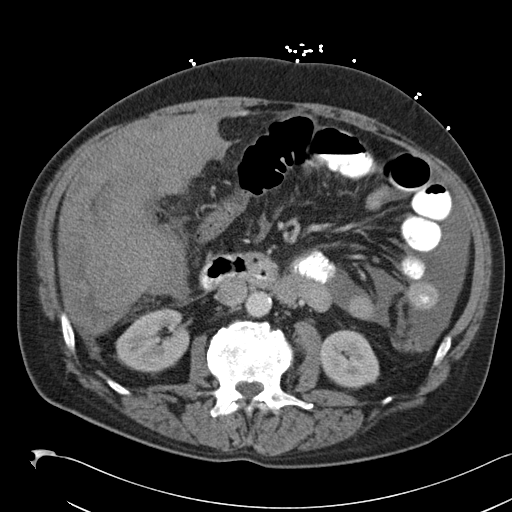
[im 60/99  soft-tissue]
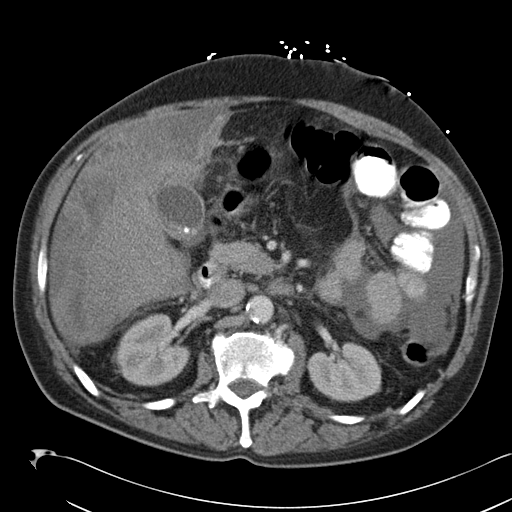
[im 71/99  soft-tissue]
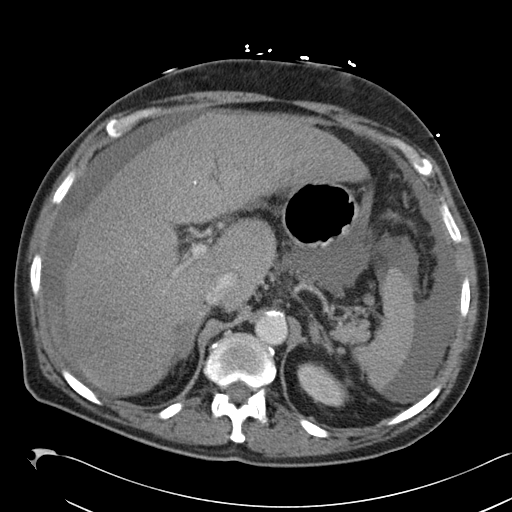
[im 71/99  bone]
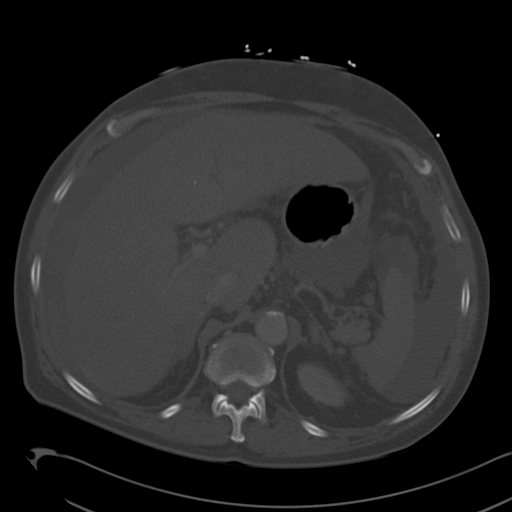
[im 77/99  soft-tissue]
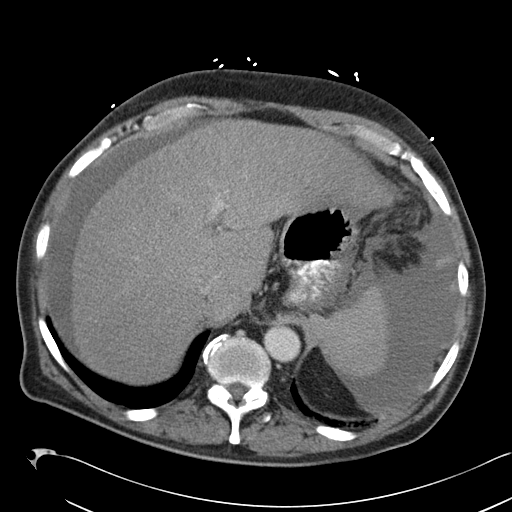
[im 82/99  soft-tissue]
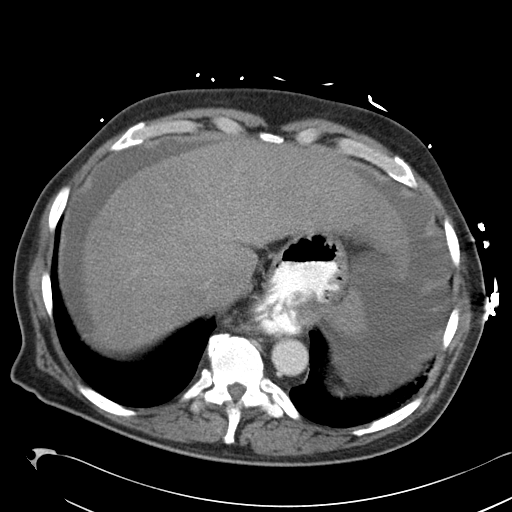
[im 93/99  soft-tissue]
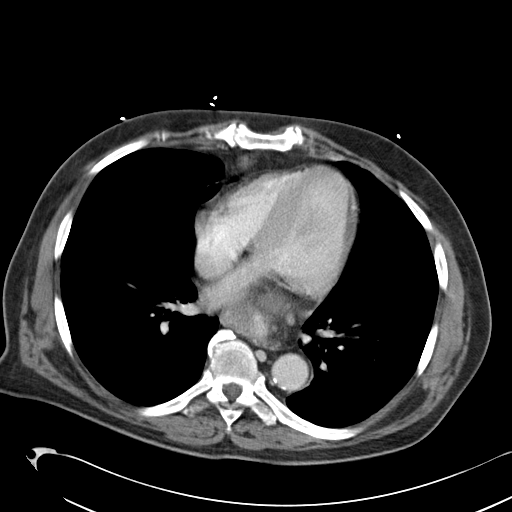

[Series 5: cor routine abd pel with · coronal · 0.72mm/px · 3 of 150 slices shown]
[im 50/150  soft-tissue]
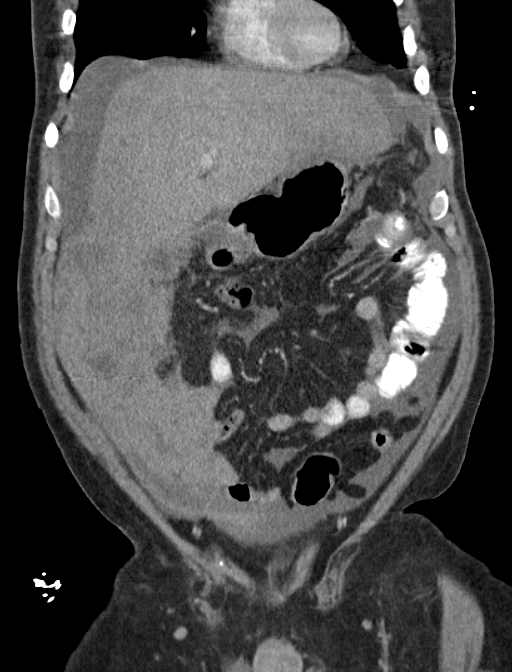
[im 67/150  soft-tissue]
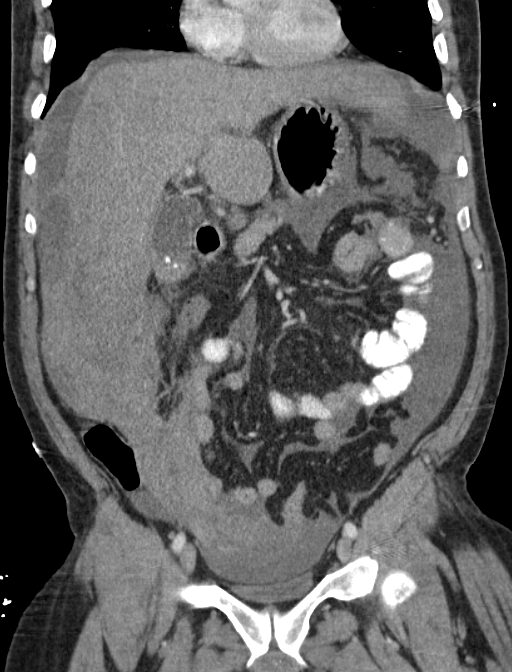
[im 83/150  soft-tissue]
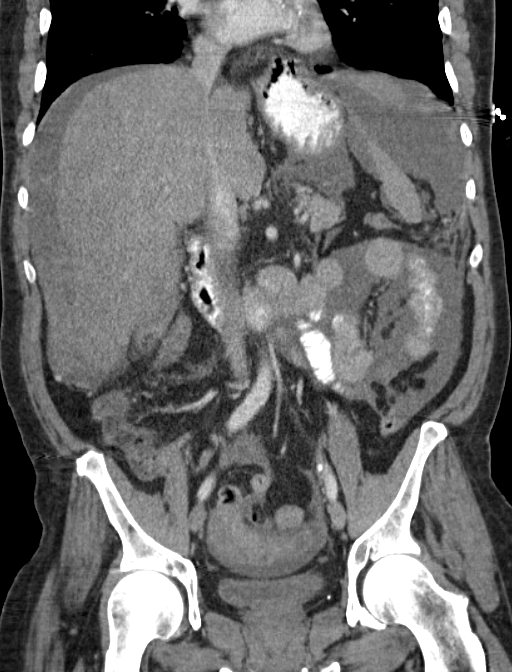

[15 of 46 positions shown; findings below may reference images not displayed]

FINDINGS: Lower chest:  Motion artifact through the lung bases.

Liver: Nodular contours consistent with cirrhosis. Liver is
enlarged. Heterogeneous fluid tracks along the anterior liver, no
definite focal lesion is seen. There is re- cannula is umbilical
vein.

Hepatobiliary: Dependent gallstones. No disproportionate
pericholecystic inflammation.

Pancreas: No ductal dilatation or inflammation.

Spleen: Normal in size.

Adrenal glands: No nodule.

Kidneys: Symmetric renal enhancement.  No hydronephrosis.

Stomach/Bowel: Stomach physiologically distended. Moderate hiatal
hernia distended with contrast. There are no dilated or thickened
small bowel loops. Small volume of stool throughout the colon
without colonic wall thickening. The appendix is normal.

Vascular/Lymphatic: No retroperitoneal adenopathy. Abdominal aorta
is normal in caliber. Atherosclerosis without aneurysm. Periaortic
soft tissue stranding or fluid.

Reproductive: Normal for age.

Bladder: Minimally distended.

Other: Large volume hemo peritoneum. This is most prominent in the
right mid abdomen. Source appears to be a prominent collateral
vessel coursing in the pericolic gutter. Origin of this collateral
vessel appears mesenteric. There is blood tracking into the pelvis
and right upper quadrant. More simple free fluid in the left upper
quadrant and pelvis may be background ascites.

Musculoskeletal: There are no acute or suspicious osseous
abnormalities. Lumbar compression deformities are again seen.
IMPRESSION: 1. Large volume hemoperitoneum, most prominent in the right abdomen.
Suspected source is a prominent collateral vessel coursing in the
pericolic gutter, which appears to be is enteric in origin.
Alternatively, i.e. bleeding hepatic lesion is considered, however
felt less likely as no focal lesion was seen on CT performed last
month. Patient does have underlying cirrhosis.
2. Additional chronic findings as described.
Critical Value/emergent results were called by telephone at the time
of interpretation on 11/05/2015 at [DATE] to Dr. OLVIN PAGUAY , who
verbally acknowledged these results.

## 2017-12-28 ENCOUNTER — Other Ambulatory Visit: Payer: Self-pay

## 2017-12-28 ENCOUNTER — Inpatient Hospital Stay
Admission: EM | Admit: 2017-12-28 | Discharge: 2017-12-30 | DRG: 638 | Disposition: A | Discharge status: Home / Self Care | Payer: BLUE CROSS/BLUE SHIELD | Attending: Internal Medicine | Admitting: Internal Medicine

## 2017-12-28 ENCOUNTER — Encounter: Payer: Self-pay | Admitting: Emergency Medicine

## 2017-12-28 DIAGNOSIS — F1010 Alcohol abuse, uncomplicated: Secondary | ICD-10-CM | POA: Diagnosis present

## 2017-12-28 DIAGNOSIS — N179 Acute kidney failure, unspecified: Secondary | ICD-10-CM

## 2017-12-28 DIAGNOSIS — G250 Essential tremor: Secondary | ICD-10-CM | POA: Diagnosis present

## 2017-12-28 DIAGNOSIS — D6959 Other secondary thrombocytopenia: Secondary | ICD-10-CM | POA: Diagnosis present

## 2017-12-28 DIAGNOSIS — E1110 Type 2 diabetes mellitus with ketoacidosis without coma: Principal | ICD-10-CM | POA: Diagnosis present

## 2017-12-28 DIAGNOSIS — M1990 Unspecified osteoarthritis, unspecified site: Secondary | ICD-10-CM | POA: Diagnosis present

## 2017-12-28 DIAGNOSIS — Z79899 Other long term (current) drug therapy: Secondary | ICD-10-CM

## 2017-12-28 DIAGNOSIS — Z888 Allergy status to other drugs, medicaments and biological substances status: Secondary | ICD-10-CM

## 2017-12-28 DIAGNOSIS — J449 Chronic obstructive pulmonary disease, unspecified: Secondary | ICD-10-CM | POA: Diagnosis present

## 2017-12-28 DIAGNOSIS — F17200 Nicotine dependence, unspecified, uncomplicated: Secondary | ICD-10-CM | POA: Diagnosis present

## 2017-12-28 DIAGNOSIS — K7030 Alcoholic cirrhosis of liver without ascites: Secondary | ICD-10-CM | POA: Diagnosis present

## 2017-12-28 DIAGNOSIS — I10 Essential (primary) hypertension: Secondary | ICD-10-CM | POA: Diagnosis present

## 2017-12-28 DIAGNOSIS — R7989 Other specified abnormal findings of blood chemistry: Secondary | ICD-10-CM

## 2017-12-28 DIAGNOSIS — R350 Frequency of micturition: Secondary | ICD-10-CM | POA: Diagnosis not present

## 2017-12-28 DIAGNOSIS — E785 Hyperlipidemia, unspecified: Secondary | ICD-10-CM | POA: Diagnosis present

## 2017-12-28 DIAGNOSIS — E119 Type 2 diabetes mellitus without complications: Secondary | ICD-10-CM

## 2017-12-28 DIAGNOSIS — D649 Anemia, unspecified: Secondary | ICD-10-CM | POA: Diagnosis present

## 2017-12-28 DIAGNOSIS — M109 Gout, unspecified: Secondary | ICD-10-CM | POA: Diagnosis present

## 2017-12-28 HISTORY — DX: Unspecified cirrhosis of liver: K74.60

## 2017-12-28 LAB — COMPREHENSIVE METABOLIC PANEL
ALBUMIN: 3.8 g/dL (ref 3.5–5.0)
ALK PHOS: 152 U/L — AB (ref 38–126)
ALT: 18 U/L (ref 0–44)
AST: 42 U/L — AB (ref 15–41)
Anion gap: 21 — ABNORMAL HIGH (ref 5–15)
BUN: 26 mg/dL — AB (ref 6–20)
CALCIUM: 9.3 mg/dL (ref 8.9–10.3)
CO2: 15 mmol/L — AB (ref 22–32)
CREATININE: 1.66 mg/dL — AB (ref 0.61–1.24)
Chloride: 93 mmol/L — ABNORMAL LOW (ref 98–111)
GFR calc Af Amer: 51 mL/min — ABNORMAL LOW (ref >60)
GFR calc non Af Amer: 44 mL/min — ABNORMAL LOW (ref >60)
GLUCOSE: 838 mg/dL — AB (ref 70–99)
Potassium: 4.4 mmol/L (ref 3.5–5.1)
Sodium: 129 mmol/L — ABNORMAL LOW (ref 135–145)
Total Bilirubin: 0.9 mg/dL (ref 0.3–1.2)
Total Protein: 8.8 g/dL — ABNORMAL HIGH (ref 6.5–8.1)

## 2017-12-28 LAB — URINALYSIS, COMPLETE (UACMP) WITH MICROSCOPIC
BACTERIA UA: NONE SEEN
Bilirubin Urine: NEGATIVE
Hgb urine dipstick: NEGATIVE
KETONES UR: 5 mg/dL — AB
LEUKOCYTES UA: NEGATIVE
Nitrite: NEGATIVE
PH: 6 (ref 5.0–8.0)
Protein, ur: NEGATIVE mg/dL
SQUAMOUS EPITHELIAL / LPF: NONE SEEN (ref 0–5)
Specific Gravity, Urine: 1.027 (ref 1.005–1.030)

## 2017-12-28 LAB — CBC WITH DIFFERENTIAL/PLATELET
BASOS PCT: 1 %
Basophils Absolute: 0.1 10*3/uL (ref 0–0.1)
EOS ABS: 0.2 10*3/uL (ref 0–0.7)
Eosinophils Relative: 2 %
HEMATOCRIT: 33.9 % — AB (ref 40.0–52.0)
HEMOGLOBIN: 9.9 g/dL — AB (ref 13.0–18.0)
Lymphocytes Relative: 31 %
Lymphs Abs: 3.3 10*3/uL (ref 1.0–3.6)
MCH: 20.3 pg — ABNORMAL LOW (ref 26.0–34.0)
MCHC: 29.2 g/dL — AB (ref 32.0–36.0)
MCV: 69.7 fL — ABNORMAL LOW (ref 80.0–100.0)
MONOS PCT: 8 %
Monocytes Absolute: 0.9 10*3/uL (ref 0.2–1.0)
NEUTROS ABS: 6 10*3/uL (ref 1.4–6.5)
NEUTROS PCT: 58 %
Platelets: 34 10*3/uL — ABNORMAL LOW (ref 150–440)
RBC: 4.87 MIL/uL (ref 4.40–5.90)
RDW: 19.3 % — ABNORMAL HIGH (ref 11.5–14.5)
WBC: 10.6 10*3/uL (ref 3.8–10.6)

## 2017-12-28 LAB — AMMONIA: Ammonia: 82 umol/L — ABNORMAL HIGH (ref 9–35)

## 2017-12-28 LAB — BLOOD GAS, VENOUS
ACID-BASE DEFICIT: 8 mmol/L — AB (ref 0.0–2.0)
Bicarbonate: 16.4 mmol/L — ABNORMAL LOW (ref 20.0–28.0)
O2 SAT: 86.8 %
PATIENT TEMPERATURE: 37
pCO2, Ven: 29 mmHg — ABNORMAL LOW (ref 44.0–60.0)
pH, Ven: 7.36 (ref 7.250–7.430)
pO2, Ven: 55 mmHg — ABNORMAL HIGH (ref 32.0–45.0)

## 2017-12-28 LAB — GLUCOSE, CAPILLARY
Glucose-Capillary: >600 mg/dL (ref 70–99)
Glucose-Capillary: 501 mg/dL (ref 70–99)

## 2017-12-28 MED ORDER — LACTULOSE 10 GM/15ML PO SOLN
30.0000 g | Freq: Once | ORAL | Status: AC
  Administered 2017-12-28: 30 g via ORAL
  Filled 2017-12-28: qty 60

## 2017-12-28 MED ORDER — SODIUM CHLORIDE 0.9 % IV SOLN
INTRAVENOUS | Status: DC
  Administered 2017-12-28: via INTRAVENOUS

## 2017-12-28 MED ORDER — DEXTROSE-NACL 5-0.45 % IV SOLN
INTRAVENOUS | Status: DC
  Administered 2017-12-29: 03:00:00 via INTRAVENOUS

## 2017-12-28 MED ORDER — SODIUM CHLORIDE 0.9 % IV SOLN
INTRAVENOUS | Status: DC
  Administered 2017-12-28: 8.7 [IU]/h via INTRAVENOUS

## 2017-12-28 MED ORDER — DOCUSATE SODIUM 100 MG PO CAPS
100.0000 mg | ORAL_CAPSULE | Freq: Two times a day (BID) | ORAL | Status: DC
  Administered 2017-12-28 – 2017-12-30 (×4): 100 mg via ORAL
  Filled 2017-12-28 (×4): qty 1

## 2017-12-28 MED ORDER — SODIUM CHLORIDE 0.9 % IV SOLN
INTRAVENOUS | Status: DC
Start: 2017-12-28 — End: 2017-12-29
  Administered 2017-12-28: 999 mL/h via INTRAVENOUS

## 2017-12-28 MED ORDER — DEXTROSE-NACL 5-0.45 % IV SOLN: INTRAVENOUS | Status: DC

## 2017-12-28 MED ORDER — POTASSIUM CHLORIDE 10 MEQ/100ML IV SOLN
10.0000 meq | INTRAVENOUS | Status: AC
  Administered 2017-12-28 – 2017-12-29 (×2): 10 meq via INTRAVENOUS
  Filled 2017-12-28 (×2): qty 100

## 2017-12-28 MED ORDER — PROPRANOLOL HCL 20 MG PO TABS
40.0000 mg | ORAL_TABLET | Freq: Two times a day (BID) | ORAL | Status: DC
  Administered 2017-12-28 – 2017-12-29 (×3): 40 mg via ORAL
  Filled 2017-12-28 (×4): qty 2

## 2017-12-28 MED ORDER — NICOTINE 14 MG/24HR TD PT24
14.0000 mg | MEDICATED_PATCH | Freq: Every day | TRANSDERMAL | Status: DC
  Administered 2017-12-29 – 2017-12-30 (×2): 14 mg via TRANSDERMAL
  Filled 2017-12-28 (×3): qty 1

## 2017-12-28 MED ORDER — SODIUM CHLORIDE 0.9 % IV BOLUS
1000.0000 mL | Freq: Once | INTRAVENOUS | Status: AC
  Administered 2017-12-28: 1000 mL via INTRAVENOUS

## 2017-12-28 MED ORDER — AMLODIPINE BESYLATE 5 MG PO TABS
5.0000 mg | ORAL_TABLET | Freq: Every day | ORAL | Status: DC
  Administered 2017-12-29: 5 mg via ORAL
  Filled 2017-12-28 (×2): qty 1

## 2017-12-28 MED ORDER — SODIUM CHLORIDE 0.9 % IV SOLN
INTRAVENOUS | Status: DC
  Administered 2017-12-28: 5.4 [IU]/h via INTRAVENOUS
  Filled 2017-12-28: qty 1

## 2017-12-28 MED ORDER — SODIUM CHLORIDE 0.9 % IV SOLN
INTRAVENOUS | Status: DC
  Administered 2017-12-28: 22:00:00 via INTRAVENOUS

## 2017-12-28 NOTE — ED Provider Notes (Addendum)
[facility] Emergency Department Provider Note  ____________________________________________  Time seen: Approximately 7:17 PM  I have reviewed the triage vital signs and the nursing notes.   HISTORY  Chief Complaint Leg Pain and Fall   HPI Raymond Erickson is a 62 y.o. 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  Patient reports 2 weeks constant severe polyuria and polydipsia.  He saw his primary care doctor 11 days ago and had a urinalysis which was positive for glucosuria.  According to the patient no lab work was done and he was told to follow-up with urology.  Since yesterday patient has been having bilateral leg cramps/spasms.  Today his legs gave out while he was trying to walk to the bathroom.  He was able to hold himself up and did not sustain a fall.  Patient had a similar episode in the bathroom here where his legs gave out but again did not sustain any falls.  He denies dysuria or hematuria, fever or chills, abdominal pain, flank pain, back pain, saddle anesthesia, urinary or bowel incontinence or retention.  Patient denies any history of diabetes.  Past Medical History:  Diagnosis Date  . Alcohol abuse   . Arthritis   . COPD with asthma (HCC)   . Essential tremor   . Gout   . Gout   . Hematuria   . HLD (hyperlipidemia)   . HTN (hypertension)     Patient Active Problem List   Diagnosis Date Noted  . Chronic obstructive pulmonary disease (HCC)   . Tobacco abuse   . Sinus tachycardia   . Hypotension   . Hematuria   . Acute renal failure (HCC)   . ETOH abuse   . Alcoholic cirrhosis of liver with ascites (HCC)   . Delirium tremens (HCC)   . Hemoperitoneum 11/05/2015  . Gross hematuria 08/29/2015  . 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 History of elevated PSA 08/29/2015    . Other hemorrhoids 08/29/2015  . Elevated prostate specific antigen (PSA) 04/26/2014  . AA (alcohol abuse) 04/18/2014  . 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 Injury of kidney 04/15/2014  . Brain disorder 04/15/2014  . 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 Gout 04/15/2014  . Decreased potassium in the blood 04/15/2014  . Hypomagnesemia 04/15/2014  . Thrombocytopenia (HCC) 04/15/2014  . Disorder of hip region 12/12/2013  . Airway hyperreactivity 11/22/2013  . BP (high blood pressure) 11/22/2013  . HLD (hyperlipidemia) 11/22/2013    Past Surgical History:  Procedure Laterality Date  . broken nose    . HERNIA REPAIR      Prior to Admission medications   Medication Sig Start Date End Date Taking? Authorizing Provider  allopurinol (ZYLOPRIM) 100 MG tablet Take 100 mg by mouth 2 (two) times daily.     [provider]  amLODipine (NORVASC) 5 MG tablet Take 1 tablet (5 mg total) by mouth daily. 07/25/16   Ramonita Lab, MD  Cholecalciferol (D3-50 PO) Take 50 mcg by mouth daily.    [provider]  ciprofloxacin (CIPRO) 500 MG tablet Take 500 mg by mouth daily.  12/16/15   [provider]  clonazePAM (KLONOPIN) 0.5 MG tablet Take 0.5 mg by mouth 2 (two) times daily as needed for anxiety.  01/21/15   [provider]  nicotine (NICODERM CQ - DOSED IN MG/24 HOURS) 14 mg/24hr  patch Place 1 patch (14 mg total) onto the skin daily. 07/25/16   Ramonita Lab, MD  omeprazole (PRILOSEC) 20 MG capsule Take 20 mg by mouth 2 (two) times daily. 07/22/16   [provider]  propranolol (INDERAL) 40 MG tablet Take 40 mg by mouth 2 (two) times daily.  01/17/14   [provider]  RA VITAMIN B-1 100 MG TABS Take 100 mg by mouth daily.  12/16/15   [provider]  spironolactone (ALDACTONE) 50 MG tablet Take 50 mg by mouth daily.  12/16/15   [provider]    Allergies Ace inhibitors and Lisinopril  Family History  Problem Relation Age  of Onset  . Cirrhosis Brother     Social History Social History   Tobacco Use  . Smoking status: Current Every Day Smoker  Substance Use Topics  . Alcohol use: Yes    Alcohol/week: 60.0 oz    Types: 100 Standard drinks or equivalent per week  . Drug use: No    Review of Systems  Constitutional: Negative for fever. Eyes: Negative for visual changes. ENT: Negative for sore throat. Neck: No neck pain  Cardiovascular: Negative for chest pain. Respiratory: Negative for shortness of breath. Endo: + Polyuria polydipsia Gastrointestinal: Negative for abdominal pain, vomiting or diarrhea. Genitourinary: Negative for dysuria. Musculoskeletal: Negative for back pain. + Leg spasm Skin: Negative for rash. Neurological: Negative for headaches, weakness or numbness. Psych: No SI or HI  ____________________________________________   PHYSICAL EXAM:  VITAL SIGNS: ED Triage Vitals  Enc Vitals Group     BP 12/28/17 1845 (!) 142/85     Pulse Rate 12/28/17 1845 93     Resp 12/28/17 1845 18     Temp 12/28/17 1845 97.7 F (36.5 C)     Temp Source 12/28/17 1845 Oral     SpO2 12/28/17 1845 98 %     Weight 12/28/17 1843 230 lb (104.3 kg)     Height 12/28/17 1843 6\' 3"  (1.905 m)     Head Circumference --      Peak Flow --      Pain Score 12/28/17 1843 8     Pain Loc --      Pain Edu? --      Excl. in GC? --     Constitutional: Alert and oriented. Well appearing and in no apparent distress. HEENT:      Head: Normocephalic and atraumatic.         Eyes: Conjunctivae are normal. Sclera is non-icteric.       Mouth/Throat: Mucous membranes are moist.       Neck: Supple with no signs of meningismus. Cardiovascular: Regular rate and rhythm. No murmurs, gallops, or rubs. 2+ symmetrical distal pulses are present in all extremities. No JVD. Respiratory: Normal respiratory effort. Lungs are clear to auscultation bilaterally. No wheezes, crackles, or rhonchi.  Gastrointestinal: Soft, non  tender, and non distended with positive bowel sounds. No rebound or guarding. Musculoskeletal: Nontender with normal range of motion in all extremities. No edema, cyanosis, or erythema of extremities.  Strong DP and PT pulses bilaterally.  Warm and well-perfused extremities.  No midline CT and L-spine tenderness Neurologic: Normal speech and language. Face is symmetric.  Intact strength and sensation of bilateral lower extremities, 2+ DTR. No gross focal neurologic deficits are appreciated. Skin: Skin is warm, dry and intact. No rash noted. Psychiatric: Mood and affect are normal. Speech and behavior are normal.  ____________________________________________   LABS (all labs ordered are  listed, but only abnormal results are displayed)  Labs Reviewed  CBC WITH DIFFERENTIAL/PLATELET - Abnormal; Notable for the following components:      Result Value   Hemoglobin 9.9 (*)    HCT 33.9 (*)    MCV 69.7 (*)    MCH 20.3 (*)    MCHC 29.2 (*)    RDW 19.3 (*)    Platelets 34 (*)    All other components within normal limits  COMPREHENSIVE METABOLIC PANEL - Abnormal; Notable for the following components:   Sodium 129 (*)    Chloride 93 (*)    CO2 15 (*)    Glucose, Bld 838 (*)    BUN 26 (*)    Creatinine, Ser 1.66 (*)    Total Protein 8.8 (*)    AST 42 (*)    Alkaline Phosphatase 152 (*)    GFR calc non Af Amer 44 (*)    GFR calc Af Amer 51 (*)    Anion gap 21 (*)    All other components within normal limits  URINALYSIS, COMPLETE (UACMP) WITH MICROSCOPIC - Abnormal; Notable for the following components:   Color, Urine STRAW (*)    APPearance CLEAR (*)    Glucose, UA >=500 (*)    Ketones, ur 5 (*)    All other components within normal limits  AMMONIA - Abnormal; Notable for the following components:   Ammonia 82 (*)    All other components within normal limits  GLUCOSE, CAPILLARY - Abnormal; Notable for the following components:   Glucose-Capillary >600 (*)    All other components  within normal limits  BLOOD GAS, VENOUS - Abnormal; Notable for the following components:   pCO2, Ven 29 (*)    pO2, Ven 55.0 (*)    Bicarbonate 16.4 (*)    Acid-base deficit 8.0 (*)    All other components within normal limits  CBG MONITORING, ED   ____________________________________________  EKG  ED ECG REPORT I, Nita Sicklearolina Myeesha Shane, the attending physician, personally viewed and interpreted this ECG.  Sinus rhythm, rate of 94, normal intervals, normal axis, no ST elevations or depressions.  No significant changes when compared to prior from 2018 ____________________________________________  RADIOLOGY  none  ____________________________________________   PROCEDURES  Procedure(s) performed: None Procedures Critical Care performed:  Yes  CRITICAL CARE Performed by: Nita Sicklearolina Benjermin Korber  ?  Total critical care time: 35 min  Critical care time was exclusive of separately billable procedures and treating other patients.  Critical care was necessary to treat or prevent imminent or life-threatening deterioration.  Critical care was time spent personally by me on the following activities: development of treatment plan with patient and/or surrogate as well as nursing, discussions with consultants, evaluation of patient's response to treatment, examination of patient, obtaining history from patient or surrogate, ordering and performing treatments and interventions, ordering and review of laboratory studies, ordering and review of radiographic studies, pulse oximetry and re-evaluation of patient's condition.  ____________________________________________   INITIAL IMPRESSION / ASSESSMENT AND PLAN / ED COURSE   62 y.o. 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  Patient had a urinalysis done by PCP 11 days ago showing  glycosuria.  At this time and concern for new onset diabetes.  Fingerstick is greater than 600.  Will check labs to rule out DKA.  There is no evidence of limb ischemia or cauda  equina, no evidence of cellulitis or pitting edema.     _________________________ 8:51 PM on 12/28/2017 -----------------------------------------  Work-up consistent with a new onset diabetes, DKA, and acute kidney injury.  Patient will be given 2 L of fluid, will be started on insulin drip and maintenance fluids.  Ammonia is elevated. Patient is not on lactulose. Will start lactulose. Patient will be admitted to the hospitalist service.   As part of my medical decision making, I reviewed the following data within the electronic MEDICAL RECORD NUMBER Nursing notes reviewed and incorporated, Labs reviewed , EKG interpreted , Old EKG reviewed, Old chart reviewed, Discussed with admitting physician , Notes from prior ED visits and [place] Controlled Substance Database    Pertinent labs & imaging results that were available during my care of the patient were reviewed by me and considered in my medical decision making (see chart for details).    ____________________________________________   FINAL CLINICAL IMPRESSION(S) / ED DIAGNOSES  Final diagnoses:  Diabetes mellitus, new onset (HCC)  Diabetic ketoacidosis without coma associated with type 2 diabetes mellitus (HCC)  AKI (acute kidney injury) (HCC)  Increased ammonia level      NEW MEDICATIONS STARTED DURING THIS VISIT:  ED Discharge Orders    None       Note:  This document was prepared using Dragon voice recognition software and may include unintentional dictation errors.    Nita Sickle, MD 12/28/17 1610    Nita Sickle, MD 12/28/17 2100

## 2017-12-28 NOTE — ED Notes (Signed)
Attempted to call report. Per RN Gearldine BienenstockBrandy they would have to call back they were in the middle of a rapid response.

## 2017-12-28 NOTE — ED Triage Notes (Addendum)
PT arrived with family with complaints of right & left leg pain/weakness. Prior to triage pt was assisted to the bathroom with family. Pt had mechanical fall. Family denies pt hitting his head or loc.

## 2017-12-28 NOTE — H&P (Addendum)
[facility] [place] at [facility]   PATIENT NAME: Raymond Erickson    MR#:  [id number]  DATE OF BIRTH:  1955/07/06  DATE OF ADMISSION:  12/28/2017  PRIMARY CARE PHYSICIAN: Rafael Bihari, MD   REQUESTING/REFERRING PHYSICIAN:   CHIEF COMPLAINT:   Chief Complaint  Patient presents with  . Leg Pain  . Fall    HISTORY OF PRESENT ILLNESS: Raymond Erickson  is a 62 y.o. male with a known history of COPD, gout, hyperlipidemia, hypertension, alcohol abuse in the past presented to the emergency room for leg spasms bilateral and increased urinary frequency.  Patient has polyuria polydipsia he saw his primary care doctor.  10 days ago and had a urinalysis positive for glucosuria.  No history of any diabetes mellitus.  Patient's legs gave away today while walking to the bathroom.  No history of any hematuria.  Blood sugar greater than 800 and anion gap is elevated.  Patient is  in diabetic ketoacidosis started on insulin drip.  His ammonia level is also elevated but mental status is normal.  No confusion and tremors.  PAST MEDICAL HISTORY:   Past Medical History:  Diagnosis Date  . Alcohol abuse   . Arthritis   . Cirrhosis of liver (HCC)   . COPD with asthma (HCC)   . Essential tremor   . Gout   . Gout   . Hematuria   . HLD (hyperlipidemia)   . HTN (hypertension)     PAST SURGICAL HISTORY:  Past Surgical History:  Procedure Laterality Date  . broken nose    . HERNIA REPAIR      SOCIAL HISTORY:  Social History   Tobacco Use  . Smoking status: Current Every Day Smoker  . Smokeless tobacco: Never Used  Substance Use Topics  . Alcohol use: Yes    Alcohol/week: 60.0 oz    Types: 100 Standard drinks or equivalent per week    FAMILY HISTORY:  Family History  Problem Relation Age of Onset  . Cirrhosis Brother     DRUG ALLERGIES:  Allergies  Allergen Reactions  . Ace Inhibitors Swelling  . Lisinopril Swelling    REVIEW OF SYSTEMS:    CONSTITUTIONAL: No fever, fatigue or weakness.  EYES: No blurred or double vision.  EARS, NOSE, AND THROAT: No tinnitus or ear pain.  RESPIRATORY: No cough, shortness of breath, wheezing or hemoptysis.  CARDIOVASCULAR: No chest pain, orthopnea, edema.  GASTROINTESTINAL: No nausea, vomiting, diarrhea or abdominal pain.  GENITOURINARY: No dysuria, hematuria.  Has poly urea, polydipsia ENDOCRINE: Has polyuria, nocturia,  HEMATOLOGY: No anemia, easy bruising or bleeding SKIN: No rash or lesion. MUSCULOSKELETAL: No joint pain or arthritis.   NEUROLOGIC: No tingling, numbness, weakness.  PSYCHIATRY: No anxiety or depression.   MEDICATIONS AT HOME:  Prior to Admission medications   Medication Sig Start Date End Date Taking? Authorizing Provider  allopurinol (ZYLOPRIM) 100 MG tablet Take 100 mg by mouth 2 (two) times daily.     [provider]  amLODipine (NORVASC) 5 MG tablet Take 1 tablet (5 mg total) by mouth daily. 07/25/16   Ramonita Lab, MD  Cholecalciferol (D3-50 PO) Take 50 mcg by mouth daily.    [provider]  ciprofloxacin (CIPRO) 500 MG tablet Take 500 mg by mouth daily.  12/16/15   [provider]  clonazePAM (KLONOPIN) 0.5 MG tablet Take 0.5 mg by mouth 2 (two) times daily as needed for anxiety.  01/21/15   [provider]  nicotine (NICODERM CQ - DOSED IN MG/24 HOURS) 14 mg/24hr patch Place 1 patch (14 mg total) onto the skin daily. 07/25/16   Ramonita LabGouru, Aruna, MD  omeprazole (PRILOSEC) 20 MG capsule Take 20 mg by mouth 2 (two) times daily. 07/22/16   [provider]  propranolol (INDERAL) 40 MG tablet Take 40 mg by mouth 2 (two) times daily.  01/17/14   [provider]  RA VITAMIN B-1 100 MG TABS Take 100 mg by mouth daily.  12/16/15   [provider]  spironolactone (ALDACTONE) 50 MG tablet Take 50 mg by mouth daily.  12/16/15   [provider]      PHYSICAL EXAMINATION:   VITAL SIGNS: Blood pressure (!)  160/99, pulse 84, temperature 97.7 F (36.5 C), temperature source Oral, resp. rate 20, height 6\' 3"  (1.905 m), weight 104.3 kg (230 lb), SpO2 (!) 71 %.  GENERAL:  62 y.o.-year-old patient lying in the bed with no acute distress.  EYES: Pupils equal, round, reactive to light and accommodation. No scleral icterus. Extraocular muscles intact.  HEENT: Head atraumatic, normocephalic. Oropharynx and nasopharynx clear.  NECK:  Supple, no jugular venous distention. No thyroid enlargement, no tenderness.  LUNGS: Normal breath sounds bilaterally, no wheezing, rales,rhonchi or crepitation. No use of accessory muscles of respiration.  CARDIOVASCULAR: S1, S2 normal. No murmurs, rubs, or gallops.  ABDOMEN: Soft, nontender, nondistended. Bowel sounds present. No organomegaly or mass.  EXTREMITIES: No pedal edema, cyanosis, or clubbing.  NEUROLOGIC: Cranial nerves II through XII are intact. Muscle strength 5/5 in all extremities. Sensation intact. Gait not checked.  PSYCHIATRIC: The patient is alert and oriented x 3.  SKIN: No obvious rash, lesion, or ulcer.   LABORATORY PANEL:   CBC Recent Labs  Lab 12/28/17 1854  WBC 10.6  HGB 9.9*  HCT 33.9*  PLT 34*  MCV 69.7*  MCH 20.3*  MCHC 29.2*  RDW 19.3*  LYMPHSABS 3.3  MONOABS 0.9  EOSABS 0.2  BASOSABS 0.1   ------------------------------------------------------------------------------------------------------------------  Chemistries  Recent Labs  Lab 12/28/17 1854  NA 129*  K 4.4  CL 93*  CO2 15*  GLUCOSE 838*  BUN 26*  CREATININE 1.66*  CALCIUM 9.3  AST 42*  ALT 18  ALKPHOS 152*  BILITOT 0.9   ------------------------------------------------------------------------------------------------------------------ estimated creatinine clearance is 62.6 mL/min (A) (by C-G formula based on SCr of 1.66 mg/dL (H)). ------------------------------------------------------------------------------------------------------------------ No results  for input(s): TSH, T4TOTAL, T3FREE, THYROIDAB in the last 72 hours.  Invalid input(s): FREET3   Coagulation profile No results for input(s): INR, PROTIME in the last 168 hours. ------------------------------------------------------------------------------------------------------------------- No results for input(s): DDIMER in the last 72 hours. -------------------------------------------------------------------------------------------------------------------  Cardiac Enzymes No results for input(s): CKMB, TROPONINI, MYOGLOBIN in the last 168 hours.  Invalid input(s): CK ------------------------------------------------------------------------------------------------------------------ Invalid input(s): POCBNP  ---------------------------------------------------------------------------------------------------------------  Urinalysis    Component Value Date/Time   COLORURINE STRAW (A) 12/28/2017 1855   APPEARANCEUR CLEAR (A) 12/28/2017 1855   APPEARANCEUR Clear 09/17/2015 1631   LABSPEC 1.027 12/28/2017 1855   PHURINE 6.0 12/28/2017 1855   GLUCOSEU >=500 (A) 12/28/2017 1855   HGBUR NEGATIVE 12/28/2017 1855   BILIRUBINUR NEGATIVE 12/28/2017 1855   BILIRUBINUR Positive (A) 09/17/2015 1631   KETONESUR 5 (A) 12/28/2017 1855   PROTEINUR NEGATIVE 12/28/2017 1855   NITRITE NEGATIVE 12/28/2017 1855   LEUKOCYTESUR NEGATIVE 12/28/2017 1855   LEUKOCYTESUR Negative 09/17/2015 1631     RADIOLOGY: No results found.  EKG: Orders placed or performed during the hospital encounter of 12/28/17  . ED EKG  .  ED EKG  . EKG 12-Lead  . EKG 12-Lead    IMPRESSION AND PLAN: 62 year old male patient with history of cirrhosis of liver, COPD, gout, hyperlipidemia, hypertension presented to the emergency room with elevated blood sugar, polyuria and polydipsia  -  Diabetic ketoacidosis Admit patient to ICU IV insulin drip to control blood sugars IV fluid hydration N.p.o. for now  -Elevated  ammonia level Mental status normal Not any encephalopathy Patient received oral lactulose in the emergency room Follow-up ammonia level in the morning  -New onset diabetes mellitus Controlled blood sugars with diabetic teaching Diabetic diet eventually  -COPD stable  -Cirrhosis of liver supportive care  -Tobacco abuse Tobacco cessation counseled for 6 minutes Nicotine patch offered  -DVT prophylaxis sequential compression device to lower extremities  -Elink Intensivist informed  All the records are reviewed and case discussed with ED provider. Management plans discussed with the patient, family and they are in agreement.  CODE STATUS:Full code Code Status History    Date Active Date Inactive Code Status Order ID Comments User Context   07/24/2016 1916 07/25/2016 1820 Full Code [id number]  Enid Baas, MD Inpatient   11/05/2015 0524 11/09/2015 1203 Full Code [id number]  Coralyn Helling, MD Inpatient       TOTAL CRITICAL CARE TIME TAKING CARE OF THIS PATIENT: 55 minutes.    Ihor Austin M.D on 12/28/2017 at 9:24 PM  Between 7am to 6pm - Pager - [phone]  After 6pm go to [email or web address] - password [email or web address] [facility]  [facility]  Office  [phone]  CC: Primary care physician; Rafael Bihari, MD

## 2017-12-28 NOTE — Consult Note (Signed)
Name: Raymond Erickson MRN: [id number] DOB: [birth year]    ADMISSION DATE:  12/28/2017 CONSULTATION DATE: 12/28/2017  REFERRING MD : Dr. Tobi Bastos   CHIEF COMPLAINT: Bilateral Leg Pain and Polyuria   BRIEF PATIENT DESCRIPTION:  62 yo male admitted with DKA/new onset diabetes mellitus requiring insulin gtt  SIGNIFICANT EVENTS/STUDIES:  07/23 Pt admitted to stepdown unit   HISTORY OF PRESENT ILLNESS:   This is a 62 yo male with a PMH of HTN, Hyperlipidemia, Hematuria, Gout, Essential Tremors, COPD, Asthma, Arthritis, Former ETOH Abuse, Current Smoker, and Cirrhosis.  He presented to [facility] ER on 07/23 with c/o bilateral leg pain/weakness and urinary frequency onset of symptoms 3 days prior to presentation.  Per ER notes he saw his PCP several days ago UA revealed glucosuria, he was instructed to follow-up with his urologist.  Today 07/23 his legs gave out, however he did not fall prompting ER visit.  In the ER lab results ruled pt in for DKA and new onset diabetes, therefore insulin gtt initiated.  He was subsequently admitted to the stepdown unit by hospitalist team for further workup and treatment.   PAST MEDICAL HISTORY :   has a past medical history of Alcohol abuse, Arthritis, Cirrhosis of liver (HCC), COPD with asthma (HCC), Essential tremor, Gout, Gout, Hematuria, HLD (hyperlipidemia), and HTN (hypertension).  has a past surgical history that includes Hernia repair and broken nose. Prior to Admission medications   Medication Sig Start Date End Date Taking? Authorizing Provider  allopurinol (ZYLOPRIM) 100 MG tablet Take 100 mg by mouth 2 (two) times daily.    Yes [provider]  amLODipine (NORVASC) 5 MG tablet Take 1 tablet (5 mg total) by mouth daily. Patient taking differently: Take 2.5 mg by mouth daily.  07/25/16  Yes Gouru, Deanna Artis, MD  clonazePAM (KLONOPIN) 0.5 MG tablet Take 0.5 mg by mouth 2 (two) times daily as needed for anxiety.  01/21/15  Yes [provider]   folic acid (FOLVITE) 1 MG tablet Take 1 mg by mouth daily. 12/07/17  Yes [provider]  omeprazole (PRILOSEC) 20 MG capsule Take 20 mg by mouth 2 (two) times daily. 07/22/16  Yes [provider]  propranolol (INDERAL) 40 MG tablet Take 40 mg by mouth 2 (two) times daily.  01/17/14  Yes [provider]  Cholecalciferol (D3-50 PO) Take 50 mcg by mouth daily.    [provider]  ciprofloxacin (CIPRO) 500 MG tablet Take 500 mg by mouth daily.  12/16/15   [provider]  nicotine (NICODERM CQ - DOSED IN MG/24 HOURS) 14 mg/24hr patch Place 1 patch (14 mg total) onto the skin daily. Patient not taking: Reported on 12/28/2017 07/25/16   Ramonita Lab, MD  RA VITAMIN B-1 100 MG TABS Take 100 mg by mouth daily.  12/16/15   [provider]  spironolactone (ALDACTONE) 50 MG tablet Take 50 mg by mouth daily.  12/16/15   [provider]   Allergies  Allergen Reactions  . Ace Inhibitors Swelling  . Lisinopril Swelling    FAMILY HISTORY:  family history includes Cirrhosis in his brother. SOCIAL HISTORY:  reports that he has been smoking.  He has never used smokeless tobacco. He reports that he drinks about 60.0 oz of alcohol per week. He reports that he does not use drugs.  REVIEW OF SYSTEMS: Positive in BOLD  Constitutional: Negative for fever, chills, weight loss, malaise/fatigue and diaphoresis.  HENT: Negative for hearing loss, ear pain, nosebleeds, congestion, sore throat,  neck pain, tinnitus and ear discharge.   Eyes: Negative for blurred vision, double vision, photophobia, pain, discharge and redness.  Respiratory: Negative for cough, hemoptysis, sputum production, shortness of breath, wheezing and stridor.   Cardiovascular: Negative for chest pain, palpitations, orthopnea, claudication, leg swelling and PND.  Gastrointestinal: Negative for heartburn, nausea, vomiting, abdominal pain, diarrhea, constipation, blood in stool and melena.    Genitourinary: Negative for dysuria, urgency, frequency, hematuria and flank pain.  Musculoskeletal: bilateral leg pain/weakness, myalgias, back pain, joint pain and falls.  Skin: Negative for itching and rash.  Neurological: Negative for dizziness, tingling, tremors, sensory change, speech change, focal weakness, seizures, loss of consciousness, weakness and headaches.  Endo/Heme/Allergies: environmental allergies and polydipsia. Does not bruise/bleed easily.  SUBJECTIVE:  c/o increased thirst   VITAL SIGNS: Temp:  [97.7 F (36.5 C)] 97.7 F (36.5 C) (07/23 1845) Pulse Rate:  [79-93] 81 (07/23 2201) Resp:  [17-21] 18 (07/23 2201) BP: (142-178)/(85-99) 178/89 (07/23 2201) SpO2:  [71 %-100 %] 99 % (07/23 2201) Weight:  [104.3 kg (230 lb)] 104.3 kg (230 lb) (07/23 1843)  PHYSICAL EXAMINATION: General: well developed, well nourished male, NAD  Neuro: alert and oriented, follows commands HEENT: supple, no JVD  Cardiovascular: nsr, rrr, no R/G Lungs: clear throughout, even, non labored  Abdomen: +BS x4, obese, soft, non tender, non distended  Musculoskeletal: normal bulk and tone, no edema  Skin: intact no rashes or lesions   Recent Labs  Lab 12/28/17 1854  NA 129*  K 4.4  CL 93*  CO2 15*  BUN 26*  CREATININE 1.66*  GLUCOSE 838*   Recent Labs  Lab 12/28/17 1854  HGB 9.9*  HCT 33.9*  WBC 10.6  PLT 34*   No results found.  ASSESSMENT / PLAN: Diabetic Ketoacidosis New onset Diabetes Mellitus   Acute renal failure  Pseudohyponatremia in setting of DKA  Anemia without obvious bleeding  Transaminitis secondary to cirrhosis  Thrombocytopenia secondary to cirrhosis Hx: Former ETOH Abuse, HTN, HLD, COPD, and Asthma  P: Supplemental O2 for dyspnea and/or hypoxia  Continuous telemetry monitoring  Continue outpatient cardiac medications  Continue DKA protocol until anion gap closed and serum CO2 >20 Hemoglobin A1c pending  Diabetes Coordinator and Dietitian consulted  appreciate input  Trend CMP  Replace electrolytes as indicated  Monitor UOP VTE px: SCD's avoid chemical prophylaxis  Trend CBC Monitor for s/sx of bleeding and transfuse for hgb <7 Tobacco abuse cessation counseling provided   Sonda Rumbleana Blakeney, AGNP  Pulmonary/Critical Care Pager [phone] (please enter 7 digits) PCCM Consult Pager [phone] (please enter 7 digits)

## 2017-12-28 NOTE — ED Triage Notes (Signed)
First Nurse Note:  C/O right leg spasms and frequent urination x 3 days.  States unable to walk well.  Patient is AAOx3.  Skin warm and dry.  NAD

## 2017-12-29 LAB — GLUCOSE, CAPILLARY
GLUCOSE-CAPILLARY: 188 mg/dL — AB (ref 70–99)
GLUCOSE-CAPILLARY: 196 mg/dL — AB (ref 70–99)
GLUCOSE-CAPILLARY: 215 mg/dL — AB (ref 70–99)
GLUCOSE-CAPILLARY: 496 mg/dL — AB (ref 70–99)
GLUCOSE-CAPILLARY: 527 mg/dL — AB (ref 70–99)
Glucose-Capillary: 285 mg/dL — ABNORMAL HIGH (ref 70–99)
Glucose-Capillary: 183 mg/dL — ABNORMAL HIGH (ref 70–99)
Glucose-Capillary: 128 mg/dL — ABNORMAL HIGH (ref 70–99)
Glucose-Capillary: 108 mg/dL — ABNORMAL HIGH (ref 70–99)
Glucose-Capillary: 354 mg/dL — ABNORMAL HIGH (ref 70–99)
Glucose-Capillary: 326 mg/dL — ABNORMAL HIGH (ref 70–99)
Glucose-Capillary: 364 mg/dL — ABNORMAL HIGH (ref 70–99)

## 2017-12-29 LAB — BASIC METABOLIC PANEL
Anion gap: 9 (ref 5–15)
Anion gap: 5 (ref 5–15)
BUN: 18 mg/dL (ref 6–20)
BUN: 22 mg/dL — AB (ref 6–20)
CHLORIDE: 106 mmol/L (ref 98–111)
CO2: 24 mmol/L (ref 22–32)
CO2: 22 mmol/L (ref 22–32)
CREATININE: 1.11 mg/dL (ref 0.61–1.24)
Calcium: 8.7 mg/dL — ABNORMAL LOW (ref 8.9–10.3)
Calcium: 8.5 mg/dL — ABNORMAL LOW (ref 8.9–10.3)
Chloride: 110 mmol/L (ref 98–111)
Creatinine, Ser: 0.92 mg/dL (ref 0.61–1.24)
GFR calc Af Amer: >60 mL/min (ref >60)
GFR calc Af Amer: >60 mL/min (ref >60)
GFR calc non Af Amer: >60 mL/min (ref >60)
GFR calc non Af Amer: >60 mL/min (ref >60)
Glucose, Bld: 440 mg/dL — ABNORMAL HIGH (ref 70–99)
Glucose, Bld: 176 mg/dL — ABNORMAL HIGH (ref 70–99)
Potassium: 4.2 mmol/L (ref 3.5–5.1)
Potassium: 3.4 mmol/L — ABNORMAL LOW (ref 3.5–5.1)
SODIUM: 137 mmol/L (ref 135–145)
Sodium: 139 mmol/L (ref 135–145)

## 2017-12-29 LAB — HEMOGLOBIN A1C
Hgb A1c MFr Bld: 12.8 % — ABNORMAL HIGH (ref 4.8–5.6)
Mean Plasma Glucose: 320.66 mg/dL

## 2017-12-29 LAB — MRSA PCR SCREENING: MRSA by PCR: NEGATIVE

## 2017-12-29 LAB — AMMONIA: Ammonia: 52 umol/L — ABNORMAL HIGH (ref 9–35)

## 2017-12-29 MED ORDER — NICOTINE 14 MG/24HR TD PT24: 14.0000 mg | MEDICATED_PATCH | Freq: Every day | TRANSDERMAL | 0 refills | Status: AC

## 2017-12-29 MED ORDER — INSULIN GLARGINE 100 UNIT/ML ~~LOC~~ SOLN
10.0000 [IU] | Freq: Every day | SUBCUTANEOUS | Status: DC
  Administered 2017-12-29: 10 [IU] via SUBCUTANEOUS
  Filled 2017-12-29 (×2): qty 0.1

## 2017-12-29 MED ORDER — LIVING WELL WITH DIABETES BOOK
Freq: Once | Status: AC
  Administered 2017-12-29: 09:00:00
  Filled 2017-12-29: qty 1

## 2017-12-29 MED ORDER — INSULIN ASPART 100 UNIT/ML ~~LOC~~ SOLN
0.0000 [IU] | Freq: Every day | SUBCUTANEOUS | Status: DC
  Administered 2017-12-29: 5 [IU] via SUBCUTANEOUS
  Filled 2017-12-29: qty 1

## 2017-12-29 MED ORDER — POTASSIUM CHLORIDE CRYS ER 20 MEQ PO TBCR
40.0000 meq | EXTENDED_RELEASE_TABLET | Freq: Once | ORAL | Status: AC
  Administered 2017-12-29: 40 meq via ORAL
  Filled 2017-12-29: qty 2

## 2017-12-29 MED ORDER — INSULIN ASPART 100 UNIT/ML ~~LOC~~ SOLN
0.0000 [IU] | Freq: Three times a day (TID) | SUBCUTANEOUS | Status: DC
  Administered 2017-12-29: 11 [IU] via SUBCUTANEOUS
  Administered 2017-12-29 – 2017-12-30 (×3): 15 [IU] via SUBCUTANEOUS
  Filled 2017-12-29 (×5): qty 1

## 2017-12-29 MED ORDER — AMLODIPINE BESYLATE 5 MG PO TABS: 2.5000 mg | ORAL_TABLET | Freq: Every day | ORAL | Status: DC

## 2017-12-29 MED ORDER — INSULIN GLARGINE 100 UNIT/ML ~~LOC~~ SOLN: 10.0000 [IU] | Freq: Every day | SUBCUTANEOUS | 0 refills | Status: DC

## 2017-12-29 MED ORDER — INSULIN GLARGINE 100 UNIT/ML ~~LOC~~ SOLN
13.0000 [IU] | SUBCUTANEOUS | Status: AC
  Administered 2017-12-29: 13 [IU] via SUBCUTANEOUS
  Filled 2017-12-29: qty 0.13

## 2017-12-29 MED ORDER — BLOOD GLUCOSE MONITOR KIT: PACK | 0 refills | Status: AC

## 2017-12-29 MED ORDER — INSULIN STARTER KIT- PEN NEEDLES (ENGLISH): 1.0000 | Freq: Once | 0 refills | Status: AC

## 2017-12-29 MED ORDER — INSULIN STARTER KIT- PEN NEEDLES (ENGLISH)
1.0000 | Freq: Once | Status: AC
  Administered 2017-12-29: 1
  Filled 2017-12-29: qty 1

## 2017-12-29 MED ORDER — INSULIN GLARGINE 100 UNIT/ML ~~LOC~~ SOLN
23.0000 [IU] | Freq: Every day | SUBCUTANEOUS | Status: DC
  Filled 2017-12-29: qty 0.23

## 2017-12-29 MED ORDER — INSULIN ASPART 100 UNIT/ML IV SOLN
4.0000 [IU] | Freq: Three times a day (TID) | INTRAVENOUS | Status: DC
  Administered 2017-12-29: 4 [IU] via SUBCUTANEOUS
  Filled 2017-12-29 (×2): qty 0.04

## 2017-12-29 NOTE — Progress Notes (Signed)
Pt arrived to room 141 from ICU. Family at bedside. IV saline locked. Pt on room air. Pt ambulated to bathroom with 1 assist. VSS. Phone and call bell within reach. Skin assessment completed. No questions or complaints from pt at this time.

## 2017-12-29 NOTE — Progress Notes (Signed)
Notified Dr. Sung AmabileSimonds of CBG 354. Followed sliding scale as order. See MAR. Pt is asymptomatic A&O. Provided diabetes education, insulin administration.

## 2017-12-29 NOTE — Progress Notes (Signed)
Inpatient Diabetes Program Recommendations  AACE/ADA: New Consensus Statement on Inpatient Glycemic Control (2019)  Target Ranges:  Prepandial:   less than 140 mg/dL      Peak postprandial:   less than 180 mg/dL (1-2 hours)      Critically ill patients:  140 - 180 mg/dL   Results for Raymond Erickson, Raymond Erickson (MRN [id number]) as of 12/29/2017 14:13  Ref. Range 12/29/2017 05:09 12/29/2017 06:10 12/29/2017 07:17 12/29/2017 08:01 12/29/2017 12:16  Glucose-Capillary Latest Ref Range: 70 - 99 mg/dL 409188 (H) 811183 (H) 914128 (H) 108 (H) 354 (H)  Results for Raymond Erickson, Raymond Erickson (MRN [id number]) as of 12/29/2017 14:13  Ref. Range 12/29/2017 06:06  Hemoglobin A1C Latest Ref Range: 4.8 - 5.6 % 12.8 (H)  Results for Raymond Erickson, Raymond Erickson (MRN [id number]) as of 12/29/2017 14:13  Ref. Range 12/28/2017 18:54  Glucose Latest Ref Range: 70 - 99 mg/dL 629838 (HH)    Review of Glycemic Control  Diabetes history: NO Outpatient Diabetes medications: NA Current orders for Inpatient glycemic control: Lantus 10 units daily, Novolog 0-15 units TID with meals,  Inpatient Diabetes Program Recommendations: Insulin - Basal: Please consider ordering Lantus 13 units x1 now and increase Lantus to 23 units daily (starting on 12/30/17). Insulin - Meal Coverage: Please consider ordering Novolog 4 units TID with meals for meal coverage if patient eats at least 50% of meals. HgbA1C: A1C 12.8% on 12/29/17 indicating an average glucose of 320 mg/dl over the past 2-3 months.   NOTE: Diabetes Coordinator is not on campus today but will be on campus tomorrow to see patient face to face. Called patient over the phone to discuss new DM dx. Patient states that he has difficulty with speech at times and that it may be difficult to understand.  Patient reports that he has never been told he had DM. Patient states that his brother and sister have DM.  Inquired about any recent steroids and patient states that as far as he knows, he has not had any steroids in the  past 3 months. Patient notes that he had hemorrhoid surgery in June but does not think he received any steroids. Patient reports that he has had excessive thirst and urination for past few weeks and that he has been drinking water, sodas, juice, Gatorade.  Briefly spoke with patient about new diabetes diagnosis.  Informed patient that Living Well with diabetes booklet has been ordered and asked patient to read the entire book before tomorrow morning.  Informed patient that RN will be asking him to self-administer insulin to ensure proper technique and ability to administer self insulin shots. Patient inquired about why glucose went up to 354 mg/dl. Explained that insulin is usually started with low dose and increased as needed and that it would be recommended to increase insulin.  Patient verbalized understanding of information discussed and he states that he has no further questions at this time related to diabetes. However, patient reports that his hand is itching and has some numbness which was an issue before coming to the hospital. Informed patient that information would be shared with RN.  RNs to provide ongoing basic DM education at bedside with this patient and engage patient to actively check blood glucose and administer insulin injections.  Lennox Laityalled Ashley, RN and updated on information discussed and patient's concern regarding hand. Will plan to see patient tomorrow.   Thanks, Orlando PennerMarie Tylea Hise, RN, MSN, CDE Diabetes Coordinator Inpatient Diabetes Program [phone] (Team Pager from 8am to 5pm)

## 2017-12-29 NOTE — Discharge Summary (Addendum)
[place] [facility] [place] NAME: Raymond Erickson    MR#:  [id number]  DATE OF BIRTH:  25-Jan-1956  DATE OF ADMISSION:  12/28/2017 ADMITTING PHYSICIAN: Saundra Shelling, MD  DATE OF DISCHARGE: 12/30/2017  PRIMARY CARE PHYSICIAN: Baxter Hire, MD    ADMISSION DIAGNOSIS:  AKI (acute kidney injury) ([place]) [N17.9] Diabetes mellitus, new onset ([place]) [E11.9] Increased ammonia level [R79.89] Diabetic ketoacidosis without coma associated with type 2 diabetes mellitus ([place]) [E11.10]  DISCHARGE DIAGNOSIS:  Active Problems:   DKA (diabetic ketoacidoses) ([place])   SECONDARY DIAGNOSIS:   Past Medical History:  Diagnosis Date  . Alcohol abuse   . Arthritis   . Cirrhosis of liver ([place])   . COPD with asthma ([place])   . Essential tremor   . Gout   . Gout   . Hematuria   . HLD (hyperlipidemia)   . HTN (hypertension)     HOSPITAL COURSE:   62 year old male with a history of COPD, essential hypertension and EtOH who presented to the emergency room due to leg pain and found to have blood sugar greater than 800  1.  New onset diabetes with DKA: DKA has resolved with DKA protocol.  Patient was transitioned to Lantus with meal coverage and SSI.  Patient had diabetes teaching.  He will need to follow-up with his primary care physician within the next few days.  His A1c is greater than 12.  2.  Essential hypertension: Continue Norvasc  3.  History of EtOH abuse with liver cirrhosis: Continue Aldactone and propranolol  4  History of gout: Continue allopurinol 5.Tobacco dependence: Patient is encouraged to quit smoking. Counseling was provided for 4 minutes.   DISCHARGE CONDITIONS AND DIET:   Stable for discharge diabetic diet  CONSULTS OBTAINED:    DRUG ALLERGIES:   Allergies  Allergen Reactions  . Ace Inhibitors Swelling  . Lisinopril Swelling    DISCHARGE MEDICATIONS:   Allergies as of 12/30/2017      Reactions   Ace Inhibitors Swelling    Lisinopril Swelling      Medication List    STOP taking these medications   ciprofloxacin 500 MG tablet Commonly known as:  CIPRO     TAKE these medications   allopurinol 100 MG tablet Commonly known as:  ZYLOPRIM Take 100 mg by mouth 2 (two) times daily.   amLODipine 5 MG tablet Commonly known as:  NORVASC Take 0.5 tablets (2.5 mg total) by mouth daily.   blood glucose meter kit and supplies Kit Dispense based on patient and insurance preference. Use up to four times daily as directed. (FOR ICD-9 250.00, 250.01).   clonazePAM 0.5 MG tablet Commonly known as:  KLONOPIN Take 0.5 mg by mouth 2 (two) times daily as needed for anxiety.   D3-50 PO Take 50 mcg by mouth daily.   folic acid 1 MG tablet Commonly known as:  FOLVITE Take 1 mg by mouth daily.   insulin aspart 100 UNIT/ML injection Commonly known as:  novoLOG Inject 0-15 Units into the skin 3 (three) times daily with meals. CBG 70 - 120: 0 units  CBG 121 - 150: 2 units  CBG 151 - 200: 3 units  CBG 201 - 250: 5 units  CBG 251 - 300: 8 units  CBG 301 - 350: 11 units  CBG 351 - 400: 15 units  CBG > 400 call MD   insulin aspart 100 unit/mL injection Commonly known as:  novoLOG Inject 8 Units into  the skin 3 (three) times daily before meals.   insulin aspart 100 UNIT/ML injection Commonly known as:  novoLOG Inject 0-5 Units into the skin at bedtime. CBG 70 - 120: 0 units  CBG 121 - 150: 0 units  CBG 151 - 200: 0 units  CBG 201 - 250: 2 units  CBG 251 - 300: 3 units  CBG 301 - 350: 4 units  CBG 351 - 400: 5 units  CBG > 400 call MD   insulin glargine 100 UNIT/ML injection Commonly known as:  LANTUS Inject 0.35 mLs (35 Units total) into the skin daily. Start taking on:  12/31/2017   nicotine 14 mg/24hr patch Commonly known as:  NICODERM CQ - dosed in mg/24 hours Place 1 patch (14 mg total) onto the skin daily.   omeprazole 20 MG capsule Commonly known as:  PRILOSEC Take 20 mg by mouth 2 (two) times  daily.   propranolol 40 MG tablet Commonly known as:  INDERAL Take 40 mg by mouth 2 (two) times daily.   RA VITAMIN B-1 100 MG Tabs Generic drug:  Thiamine Mononitrate Take 100 mg by mouth daily.   spironolactone 50 MG tablet Commonly known as:  ALDACTONE Take 50 mg by mouth daily.     ASK your doctor about these medications   insulin starter kit- pen needles Misc 1 kit by Other route once for 1 dose. Ask about: Should I take this medication?         Today   CHIEF COMPLAINT:  No acute issues overnight.   VITAL SIGNS:  Blood pressure 102/64, pulse 63, temperature 98.1 F (36.7 C), temperature source Oral, resp. rate 19, height 6' 1"  (1.854 m), weight 114.8 kg (253 lb 1.4 oz), SpO2 97 %.   REVIEW OF SYSTEMS:  Review of Systems  Constitutional: Negative.  Negative for chills, fever and malaise/fatigue.  HENT: Negative.  Negative for ear discharge, ear pain, hearing loss, nosebleeds and sore throat.   Eyes: Negative.  Negative for blurred vision and pain.  Respiratory: Negative.  Negative for cough, hemoptysis, shortness of breath and wheezing.   Cardiovascular: Negative.  Negative for chest pain, palpitations and leg swelling.  Gastrointestinal: Negative.  Negative for abdominal pain, blood in stool, diarrhea, nausea and vomiting.  Genitourinary: Negative.  Negative for dysuria.  Musculoskeletal: Negative.  Negative for back pain.  Skin: Negative.   Neurological: Negative for dizziness, tremors, speech change, focal weakness, seizures and headaches.  Endo/Heme/Allergies: Negative.  Does not bruise/bleed easily.  Psychiatric/Behavioral: Negative.  Negative for depression, hallucinations and suicidal ideas.     PHYSICAL EXAMINATION:  GENERAL:  62 y.o.-year-old patient lying in the bed with no acute distress.  NECK:  Supple, no jugular venous distention. No thyroid enlargement, no tenderness.  LUNGS: Normal breath sounds bilaterally, no wheezing, rales,rhonchi  No  use of accessory muscles of respiration.  CARDIOVASCULAR: S1, S2 normal. No murmurs, rubs, or gallops.  ABDOMEN: Soft, non-tender, non-distended. Bowel sounds present. No organomegaly or mass.  EXTREMITIES: No pedal edema, cyanosis, or clubbing.  PSYCHIATRIC: The patient is alert and oriented x 3.  SKIN: No obvious rash, lesion, or ulcer.   DATA REVIEW:   CBC Recent Labs  Lab 12/28/17 1854  WBC 10.6  HGB 9.9*  HCT 33.9*  PLT 34*    Chemistries  Recent Labs  Lab 12/28/17 1854  12/30/17 0329  NA 129*   < > 135  K 4.4   < > 4.2  CL 93*   < >  107  CO2 15*   < > 20*  GLUCOSE 838*   < > 327*  BUN 26*   < > 16  CREATININE 1.66*   < > 1.05  CALCIUM 9.3   < > 8.2*  AST 42*  --   --   ALT 18  --   --   ALKPHOS 152*  --   --   BILITOT 0.9  --   --    < > = values in this interval not displayed.    Cardiac Enzymes No results for input(s): TROPONINI in the last 168 hours.  Microbiology Results  @MICRORSLT48 @  RADIOLOGY:  No results found.    Allergies as of 12/30/2017      Reactions   Ace Inhibitors Swelling   Lisinopril Swelling      Medication List    STOP taking these medications   ciprofloxacin 500 MG tablet Commonly known as:  CIPRO     TAKE these medications   allopurinol 100 MG tablet Commonly known as:  ZYLOPRIM Take 100 mg by mouth 2 (two) times daily.   amLODipine 5 MG tablet Commonly known as:  NORVASC Take 0.5 tablets (2.5 mg total) by mouth daily.   blood glucose meter kit and supplies Kit Dispense based on patient and insurance preference. Use up to four times daily as directed. (FOR ICD-9 250.00, 250.01).   clonazePAM 0.5 MG tablet Commonly known as:  KLONOPIN Take 0.5 mg by mouth 2 (two) times daily as needed for anxiety.   D3-50 PO Take 50 mcg by mouth daily.   folic acid 1 MG tablet Commonly known as:  FOLVITE Take 1 mg by mouth daily.   insulin aspart 100 UNIT/ML injection Commonly known as:  novoLOG Inject 0-15 Units into  the skin 3 (three) times daily with meals. CBG 70 - 120: 0 units  CBG 121 - 150: 2 units  CBG 151 - 200: 3 units  CBG 201 - 250: 5 units  CBG 251 - 300: 8 units  CBG 301 - 350: 11 units  CBG 351 - 400: 15 units  CBG > 400 call MD   insulin aspart 100 unit/mL injection Commonly known as:  novoLOG Inject 8 Units into the skin 3 (three) times daily before meals.   insulin aspart 100 UNIT/ML injection Commonly known as:  novoLOG Inject 0-5 Units into the skin at bedtime. CBG 70 - 120: 0 units  CBG 121 - 150: 0 units  CBG 151 - 200: 0 units  CBG 201 - 250: 2 units  CBG 251 - 300: 3 units  CBG 301 - 350: 4 units  CBG 351 - 400: 5 units  CBG > 400 call MD   insulin glargine 100 UNIT/ML injection Commonly known as:  LANTUS Inject 0.35 mLs (35 Units total) into the skin daily. Start taking on:  12/31/2017   nicotine 14 mg/24hr patch Commonly known as:  NICODERM CQ - dosed in mg/24 hours Place 1 patch (14 mg total) onto the skin daily.   omeprazole 20 MG capsule Commonly known as:  PRILOSEC Take 20 mg by mouth 2 (two) times daily.   propranolol 40 MG tablet Commonly known as:  INDERAL Take 40 mg by mouth 2 (two) times daily.   RA VITAMIN B-1 100 MG Tabs Generic drug:  Thiamine Mononitrate Take 100 mg by mouth daily.   spironolactone 50 MG tablet Commonly known as:  ALDACTONE Take 50 mg by mouth daily.     ASK  your doctor about these medications   insulin starter kit- pen needles Misc 1 kit by Other route once for 1 dose. Ask about: Should I take this medication?         Management plans discussed with the patient and he is in agreement. Stable for discharge home  Patient should follow up with pcp  CODE STATUS:     Code Status Orders  (From admission, onward)        Start     Ordered   12/28/17 2231  Full code  Continuous     12/28/17 2231    Code Status History    Date Active Date Inactive Code Status Order ID Comments User Context   07/24/2016 1916  07/25/2016 1820 Full Code [id number]  Gladstone Lighter, MD Inpatient   11/05/2015 0524 11/09/2015 1203 Full Code [id number]  Chesley Mires, MD Inpatient    Advance Directive Documentation     Most Recent Value  Type of Advance Directive  Healthcare Power of Attorney  Pre-existing out of facility DNR order (yellow form or pink MOST form)  -  "MOST" Form in Place?  -      TOTAL TIME TAKING CARE OF THIS PATIENT: 38 minutes.    Note: This dictation was prepared with Dragon dictation along with smaller phrase technology. Any transcriptional errors that result from this process are unintentional.  Zakara Parkey M.D on 12/30/2017 at 9:29 AM  Between 7am to 6pm - Pager - [phone] After 6pm go to [email or web address] - password [email or web address] [facility]  Office  [phone]  CC: Primary care physician; Baxter Hire, MD

## 2017-12-29 NOTE — Progress Notes (Signed)
[facility] [place] at [facility]   PATIENT NAME: Raymond Erickson    MR#:  [id number]  DATE OF BIRTH:  January 15, 1956  SUBJECTIVE:   Patient here with new diabetes  REVIEW OF SYSTEMS:    Review of Systems  Constitutional: Negative for fever, chills weight loss HENT: Negative for ear pain, nosebleeds, congestion, facial swelling, rhinorrhea, neck pain, neck stiffness and ear discharge.   Respiratory: Negative for cough, shortness of breath, wheezing  Cardiovascular: Negative for chest pain, palpitations and leg swelling.  Gastrointestinal: Negative for heartburn, abdominal pain, vomiting, diarrhea or consitpation Genitourinary: Negative for dysuria, urgency, frequency, hematuria Musculoskeletal: Negative for back pain or joint pain Neurological: Negative for dizziness, seizures, syncope, focal weakness,  numbness and headaches.  Hematological: Does not bruise/bleed easily.  Psychiatric/Behavioral: Negative for hallucinations, confusion, dysphoric mood    Tolerating Diet: yes      DRUG ALLERGIES:   Allergies  Allergen Reactions  . Ace Inhibitors Swelling  . Lisinopril Swelling    VITALS:  Blood pressure 138/84, pulse 71, temperature 99.8 F (37.7 C), temperature source Oral, resp. rate 16, height 6\' 1"  (1.854 m), weight 114.8 kg (253 lb 1.4 oz), SpO2 100 %.  PHYSICAL EXAMINATION:  Constitutional: Appears well-developed and well-nourished. No distress. HENT: Normocephalic. Marland Kitchen Oropharynx is clear and moist.  Eyes: Conjunctivae and EOM are normal. PERRLA, no scleral icterus.  Neck: Normal ROM. Neck supple. No JVD. No tracheal deviation. CVS: RRR, S1/S2 +, no murmurs, no gallops, no carotid bruit.  Pulmonary: Effort and breath sounds normal, no stridor, rhonchi, wheezes, rales.  Abdominal: Soft. BS +,  no distension, tenderness, rebound or guarding.  Musculoskeletal: Normal range of motion. No edema and no tenderness.  Neuro: Alert. CN 2-12 grossly intact.  No focal deficits. Skin: Skin is warm and dry. No rash noted. Psychiatric: Normal mood and affect.      LABORATORY PANEL:   CBC Recent Labs  Lab 12/28/17 1854  WBC 10.6  HGB 9.9*  HCT 33.9*  PLT 34*   ------------------------------------------------------------------------------------------------------------------  Chemistries  Recent Labs  Lab 12/28/17 1854  12/29/17 0606  NA 129*   < > 139  K 4.4   < > 3.4*  CL 93*   < > 110  CO2 15*   < > 24  GLUCOSE 838*   < > 176*  BUN 26*   < > 18  CREATININE 1.66*   < > 0.92  CALCIUM 9.3   < > 8.5*  AST 42*  --   --   ALT 18  --   --   ALKPHOS 152*  --   --   BILITOT 0.9  --   --    < > = values in this interval not displayed.   ------------------------------------------------------------------------------------------------------------------  Cardiac Enzymes No results for input(s): TROPONINI in the last 168 hours. ------------------------------------------------------------------------------------------------------------------  RADIOLOGY:  No results found.   ASSESSMENT AND PLAN:   62 year old male with a history of COPD, essential hypertension and EtOH who presented to the emergency room due to leg pain and found to have blood sugar greater than 800  1.  New onset diabetes with DKA: DKA has resolved with DKA protocol.  Patient was transitioned to Lantus.  Patient will have diabetes teaching.  He will need to follow-up with his primary care physician within the next few days.  His A1c is greater than 12.  2.  Essential hypertension: Continue Norvasc  3.  History of EtOH abuse with liver cirrhosis: Continue Aldactone  and propranolol  4  History of gout: Continue allopurinol 5.Tobacco dependence: Patient is encouraged to quit smoking. Counseling was provided for 4 minutes.        Management plans discussed with the patient and he is in agreement.  CODE STATUS: full  TOTAL TIME TAKING CARE OF THIS  PATIENT: 30 minutes.     POSSIBLE D/C today or tomorrow, DEPENDING ON CLINICAL CONDITION.   Vivica Dobosz M.D on 12/29/2017 at 11:42 AM  Between 7am to 6pm - Pager - [phone] After 6pm go to [email or web address] - password [email or web address]  [facility]  Office  [phone]  CC: Primary care physician; Gracelyn NurseJohnston, John D, MD  Note: This dictation was prepared with Dragon dictation along with smaller phrase technology. Any transcriptional errors that result from this process are unintentional.

## 2017-12-29 NOTE — Progress Notes (Addendum)
Inpatient Diabetes Program Recommendations  AACE/ADA: New Consensus Statement on Inpatient Glycemic Control (2019)  Target Ranges:  Prepandial:   less than 140 mg/dL      Peak postprandial:   less than 180 mg/dL (1-2 hours)      Critically ill patients:  140 - 180 mg/dL   Results for ZARION, OLIFF III (MRN [id number]) as of 12/29/2017 07:10  Ref. Range 12/29/2017 00:50 12/29/2017 01:53 12/29/2017 02:57 12/29/2017 04:03 12/29/2017 05:09 12/29/2017 06:10  Glucose-Capillary Latest Ref Range: 70 - 99 mg/dL 527 (HH) 285 (H) 215 (H) 196 (H) 188 (H) 183 (H)   Results for JEMAR, PAULSEN III (MRN [id number]) as of 12/29/2017 07:10  Ref. Range 12/28/2017 18:54  Glucose Latest Ref Range: 70 - 99 mg/dL 838 (HH)    Review of Glycemic Control  Diabetes history: No Outpatient Diabetes medications: None Current orders for Inpatient glycemic control: Lantus 10 units daily, Novolog 0-15 units TID with meals, Novolog 0-5 units QHS  Inpatient Diabetes Program Recommendations: Insulin - Basal: Noted patient was given Lantus 10 units at 6:54 am today. Will follow glucose trends to determine if additional basal insulin is needed. Insulin-Correction: Recommend changing CBG and Novolog correction to Q4H for closer glucose monitoring and correction insulin if needed. HgbA1C: A1C in process.  NOTE: Consult noted. Chart reviewed. Diabetes Coordinator is not on campus today and assessing patient from [place].  No DM history noted and glucose noted to be 140 mg/dl on 10/28/17 in [place]. Ordered: Living Well with DM, patient education by bedside RNs, and insulin starter kit. RD consult already ordered. Diabetes Coordinator will plan to see patient tomorrow 12/30/17.   NURSING: Please use each patient interaction to provide diabetes education. Please review Living Well with Diabetes booklet with the patient, instruct on glucose monitoring, and instruct on insulin administration. Please allow patient to be  actively engaged with diabetes management by allowing patient to check own glucose and self-administer insulin injections.   Addendum 12/29/17_0 :40-Received page to request recommendations for outpatient DM control. Patient was given Lantus 10 units at 6:54 am today and has not been off IV insulin more than 3 hours. Do not recommend patient be discharged today. Recommend keeping inpatient so glycemic trends can be followed to help determine actual insulin needs. A1C 12.8% on 12/29/17 indicating an average glucose of 320 mg/dl over the past 2-3 months. Text paged Dr. Benjie Karvonen to discuss and she will discontinue discharge and allow patient to remain inpatient. Eustace Pen, RN to let her know that discharge will be cancelled. Requested bedside RNs educate patient on DM and allow patient to check his own glucose and self-administer insulin. Called patient's room and patient asked that I call back as he is eating lunch right now. Will try to call again today and plan to see patient in the morning.   Thanks, Barnie Alderman, RN, MSN, CDE Diabetes Coordinator Inpatient Diabetes Program [phone] (Team Pager from 8am to 5pm)

## 2017-12-29 NOTE — Progress Notes (Signed)
A&Ox4. Family at the bedsides. Denies pain at this time, vitals stable. D/C insulin drip and D5 1/2 NS per protocol. Will continue to monitor.

## 2017-12-29 NOTE — Plan of Care (Signed)
  RD consulted for nutrition education regarding diabetes.   Lab Results  Component Value Date   HGBA1C 12.8 (H) 12/29/2017   62 year old male with PMHx of EtOH abuse, cirrhosis of liver, arthritis, COPD, gout, HLD, HTN who was admitted with hyperglycemia (blood glucose 838 mg/dL) found to have new diagnosis of DM.  Met with patient and wife at bedside. Patient reports he typically has a good appetite and intake. The past few weeks his appetite has been reduced due to not feeling well. He also reports increased thirst and urination. He typically eats 2-3 meals per day and an occasional snack. He is unable to report what he usually eats at meals. Today for lunch he had spaghetti, peaches, salad, and unsweetened tea.  RD provided "Ready, Set, Start Counting" handout from the Academy of Nutrition and Dietetics. Discussed different food groups and their effects on blood sugar, emphasizing carbohydrate-containing foods. Provided list of carbohydrates and recommended serving sizes of common foods.  Discussed importance of controlled and consistent carbohydrate intake throughout the day. Provided examples of ways to balance meals/snacks and encouraged intake of high-fiber, whole grain complex carbohydrates. Teach back method used.  Expect fair compliance. Placed referral for outpatient counseling with RD regarding diabetes.  Body mass index is 33.39 kg/m. Pt meets criteria for Obesity Class I based on current BMI.  Current diet order is Heart Healthy/Carbohydrate Modified, patient is consuming approximately 100% of meals at this time. Labs and medications reviewed. No further nutrition interventions warranted at this time. RD contact information provided. If additional nutrition issues arise, please re-consult RD.  Willey Blade, MS, [place], LDN Office: [phone] Pager: [phone] After Hours/Weekend Pager: [phone]

## 2017-12-30 LAB — BASIC METABOLIC PANEL
ANION GAP: 8 (ref 5–15)
BUN: 16 mg/dL (ref 6–20)
CALCIUM: 8.2 mg/dL — AB (ref 8.9–10.3)
CO2: 20 mmol/L — AB (ref 22–32)
Chloride: 107 mmol/L (ref 98–111)
Creatinine, Ser: 1.05 mg/dL (ref 0.61–1.24)
GFR calc non Af Amer: >60 mL/min (ref >60)
GLUCOSE: 327 mg/dL — AB (ref 70–99)
POTASSIUM: 4.2 mmol/L (ref 3.5–5.1)
Sodium: 135 mmol/L (ref 135–145)

## 2017-12-30 LAB — GLUCOSE, CAPILLARY
GLUCOSE-CAPILLARY: 360 mg/dL — AB (ref 70–99)
Glucose-Capillary: 357 mg/dL — ABNORMAL HIGH (ref 70–99)

## 2017-12-30 MED ORDER — INSULIN ASPART 100 UNIT/ML IV SOLN
8.0000 [IU] | Freq: Three times a day (TID) | INTRAVENOUS | Status: DC
  Administered 2017-12-30 (×2): 8 [IU] via SUBCUTANEOUS
  Filled 2017-12-30 (×3): qty 0.08

## 2017-12-30 MED ORDER — INSULIN ASPART 100 UNIT/ML ~~LOC~~ SOLN: 0.0000 [IU] | Freq: Every day | SUBCUTANEOUS | 0 refills | Status: DC

## 2017-12-30 MED ORDER — INSULIN GLARGINE 100 UNIT/ML ~~LOC~~ SOLN: 35.0000 [IU] | Freq: Every day | SUBCUTANEOUS | 11 refills | Status: DC

## 2017-12-30 MED ORDER — INSULIN GLARGINE 100 UNIT/ML ~~LOC~~ SOLN
35.0000 [IU] | Freq: Every day | SUBCUTANEOUS | Status: DC
  Administered 2017-12-30: 35 [IU] via SUBCUTANEOUS
  Filled 2017-12-30: qty 0.35

## 2017-12-30 MED ORDER — INSULIN ASPART 100 UNIT/ML IV SOLN: 8.0000 [IU] | Freq: Three times a day (TID) | INTRAVENOUS | 12 refills | Status: DC

## 2017-12-30 MED ORDER — INSULIN ASPART 100 UNIT/ML ~~LOC~~ SOLN: 0.0000 [IU] | Freq: Three times a day (TID) | SUBCUTANEOUS | 0 refills | Status: DC

## 2017-12-30 NOTE — Progress Notes (Signed)
Education provided to patient and significant other on insulin administration, CBG collection, and s/sx of hypo/hyperglycemic episodes. Patient follow up discussed. Patient will eat lunch then discharge. IVx2 removed without complications.

## 2017-12-30 NOTE — Progress Notes (Signed)
Pt discharged to home via wheelchair accompanied by wife without incident per MD order. No change in patient from AM assessment. D/C teachings done with pt and wife by Alben SpittleBrandi Farrell RN. All d/c questions answered. Pt and wife agree to comply to all teachings. Pt denies pain on d/c.

## 2017-12-30 NOTE — Progress Notes (Addendum)
Inpatient Diabetes Program Recommendations  AACE/ADA: New Consensus Statement on Inpatient Glycemic Control (2019)  Target Ranges:  Prepandial:   less than 140 mg/dL      Peak postprandial:   less than 180 mg/dL (1-2 hours)      Critically ill patients:  140 - 180 mg/dL  Results for JORDANI, NUNN (MRN [id number]) as of 12/30/2017 07:46  Ref. Range 12/30/2017 03:29  Glucose Latest Ref Range: 70 - 99 mg/dL 327 (H)   Results for DAYNA, GEURTS III (MRN [id number]) as of 12/30/2017 07:46  Ref. Range 12/29/2017 08:01 12/29/2017 12:16 12/29/2017 16:43 12/29/2017 21:06  Glucose-Capillary Latest Ref Range: 70 - 99 mg/dL 108 (H) 354 (H) 326 (H) 364 (H)  Results for MELBURN, TREIBER III (MRN [id number]) as of 12/30/2017 07:46  Ref. Range 12/29/2017 06:06  Hemoglobin A1C Latest Ref Range: 4.8 - 5.6 % 12.8 (H)   Review of Glycemic Control Current orders for Inpatient glycemic control: Lantus 23 units daily, Novolog 0-15 units TID with meals, Novolog 0-5 units QHS, Novolog 4 units TID with meals  Inpatient Diabetes Program Recommendations: Insulin - Basal: Please consider increasing Lantus to 35 units daily. Insulin - Meal Coverage: Please consider increasing meal coverage to Novolog 8 units TID with meals for meal coverage if patient eats at least 50% of meals. HgbA1C: A1C 12.8% on 12/29/17 indicating an average glucose of 320 mg/dl over the past 2-3 months.   Addendum 12/30/17_0 :15-Spoke with patient and his wife about new diabetes diagnosis.  Discussed A1C results (12.8%) and explained what an A1C is and informed patient that his current A1C indicates an average glucose of 321 mg/dl over the past 2-3 months. Discussed basic pathophysiology of DM Type 2, basic home care, importance of checking CBGs and maintaining good CBG control to prevent long-term and short-term complications. Reviewed glucose and A1C goals and explained that patient will need to monitor glucose and take insulin at home.  Reviewed signs  and symptoms of hyperglycemia and hypoglycemia along with treatment for both. Discussed impact of nutrition, exercise, stress, sickness, and medications on diabetes control. Reviewed Living Well with diabetes booklet and encouraged patient to read through entire book. Informed patient that he will be prescribed Lantus and Novolog.  Discussed Lantus and Novolog insulin in detail (how to take it, when to take it) and instructed patient he would take a set Novolog 8 units for meals plus use a correction scale and take additional Novolog based on glucose. Asked patient to check his glucose at least 4 times per day (before meals and at bedtime) and to keep a log book of glucose readings and insulin taken. Explained how his doctor can use the log book to continue to make insulin adjustments if needed. Discussed FreeStyle Libre (flash glucose monitoring sensor) and encouraged patient to discuss with PCP if interested.  Reviewed and demonstrated how to draw up and administer insulin with vial and syringe and how to use an insulin pen. Patient states that he would prefer to use an insulin pen.  Educated patient and spouse on insulin pen use at home. Reviewed contents of insulin flexpen starter kit. Reviewed all steps of insulin pen including attachment of needle, 2-unit air shot, dialing up dose, giving injection, removing needle, disposal of sharps, storage of unused insulin, disposal of insulin etc. Patient and his wife were able to provide successful return demonstration.  Patient states that he has not self injected insulin yet. Asked patient to self-administer insulin while inpatient to ensure proper technique and  ability to administer self insulin shots.   Patient verbalized understanding of information discussed and he states that he has no further questions at this time related to diabetes.   RNs to provide ongoing basic DM education at bedside with this patient and engage patient to actively check blood glucose and  administer insulin injections. Ordered outpatient DM education. At time of discharge, patient will need Rx for: glucose monitoring kit, Lantus SoloStar pen, Novolog FlexPen, and insulin pen needles.  Thanks, Barnie Alderman, RN, MSN, CDE Diabetes Coordinator Inpatient Diabetes Program [phone] (Team Pager from 8am to 5pm)

## 2017-12-31 LAB — HIV ANTIBODY (ROUTINE TESTING W REFLEX): HIV SCREEN 4TH GENERATION: NONREACTIVE

## 2018-01-10 ENCOUNTER — Ambulatory Visit: Payer: BLUE CROSS/BLUE SHIELD | Admitting: *Deleted

## 2018-12-12 ENCOUNTER — Other Ambulatory Visit: Payer: Self-pay

## 2018-12-12 ENCOUNTER — Encounter: Payer: Self-pay | Admitting: Emergency Medicine

## 2018-12-12 ENCOUNTER — Inpatient Hospital Stay
Admission: EM | Admit: 2018-12-12 | Discharge: 2018-12-13 | DRG: 379 | Discharge status: Against Medical Advice | Payer: BC Managed Care – PPO | Attending: Internal Medicine | Admitting: Internal Medicine

## 2018-12-12 DIAGNOSIS — Z1159 Encounter for screening for other viral diseases: Secondary | ICD-10-CM

## 2018-12-12 DIAGNOSIS — I10 Essential (primary) hypertension: Secondary | ICD-10-CM | POA: Diagnosis present

## 2018-12-12 DIAGNOSIS — E785 Hyperlipidemia, unspecified: Secondary | ICD-10-CM | POA: Diagnosis present

## 2018-12-12 DIAGNOSIS — M109 Gout, unspecified: Secondary | ICD-10-CM | POA: Diagnosis present

## 2018-12-12 DIAGNOSIS — Z5329 Procedure and treatment not carried out because of patient's decision for other reasons: Secondary | ICD-10-CM | POA: Diagnosis not present

## 2018-12-12 DIAGNOSIS — D649 Anemia, unspecified: Secondary | ICD-10-CM

## 2018-12-12 DIAGNOSIS — K922 Gastrointestinal hemorrhage, unspecified: Secondary | ICD-10-CM | POA: Diagnosis not present

## 2018-12-12 DIAGNOSIS — G250 Essential tremor: Secondary | ICD-10-CM | POA: Diagnosis present

## 2018-12-12 DIAGNOSIS — K219 Gastro-esophageal reflux disease without esophagitis: Secondary | ICD-10-CM | POA: Diagnosis present

## 2018-12-12 DIAGNOSIS — K7460 Unspecified cirrhosis of liver: Secondary | ICD-10-CM | POA: Diagnosis present

## 2018-12-12 DIAGNOSIS — J449 Chronic obstructive pulmonary disease, unspecified: Secondary | ICD-10-CM | POA: Diagnosis present

## 2018-12-12 DIAGNOSIS — D696 Thrombocytopenia, unspecified: Secondary | ICD-10-CM | POA: Diagnosis present

## 2018-12-12 DIAGNOSIS — Z794 Long term (current) use of insulin: Secondary | ICD-10-CM

## 2018-12-12 DIAGNOSIS — Z79899 Other long term (current) drug therapy: Secondary | ICD-10-CM

## 2018-12-12 DIAGNOSIS — E119 Type 2 diabetes mellitus without complications: Secondary | ICD-10-CM | POA: Diagnosis present

## 2018-12-12 DIAGNOSIS — F17200 Nicotine dependence, unspecified, uncomplicated: Secondary | ICD-10-CM | POA: Diagnosis present

## 2018-12-12 LAB — COMPREHENSIVE METABOLIC PANEL
ALT: 11 U/L (ref 0–44)
AST: 21 U/L (ref 15–41)
Albumin: 3.5 g/dL (ref 3.5–5.0)
Alkaline Phosphatase: 93 U/L (ref 38–126)
Anion gap: 7 (ref 5–15)
BUN: 10 mg/dL (ref 6–20)
CO2: 22 mmol/L (ref 22–32)
Calcium: 8.5 mg/dL — ABNORMAL LOW (ref 8.9–10.3)
Chloride: 113 mmol/L — ABNORMAL HIGH (ref 98–111)
Creatinine, Ser: 0.97 mg/dL (ref 0.61–1.24)
GFR calc Af Amer: >60 mL/min (ref >60)
GFR calc non Af Amer: >60 mL/min (ref >60)
Glucose, Bld: 105 mg/dL — ABNORMAL HIGH (ref 70–99)
Potassium: 4 mmol/L (ref 3.5–5.1)
Sodium: 142 mmol/L (ref 135–145)
Total Bilirubin: 0.3 mg/dL (ref 0.3–1.2)
Total Protein: 7.1 g/dL (ref 6.5–8.1)

## 2018-12-12 LAB — CBC
HCT: 23.7 % — ABNORMAL LOW (ref 39.0–52.0)
Hemoglobin: 5.9 g/dL — ABNORMAL LOW (ref 13.0–17.0)
MCH: 14.8 pg — ABNORMAL LOW (ref 26.0–34.0)
MCHC: 24.9 g/dL — ABNORMAL LOW (ref 30.0–36.0)
MCV: 59.3 fL — ABNORMAL LOW (ref 80.0–100.0)
Platelets: 52 10*3/uL — ABNORMAL LOW (ref 150–400)
RBC: 4 MIL/uL — ABNORMAL LOW (ref 4.22–5.81)
RDW: 21.6 % — ABNORMAL HIGH (ref 11.5–15.5)
WBC: 6.2 10*3/uL (ref 4.0–10.5)
nRBC: 0 % (ref 0.0–0.2)

## 2018-12-12 LAB — PREPARE RBC (CROSSMATCH)

## 2018-12-12 LAB — SARS CORONAVIRUS 2 BY RT PCR (HOSPITAL ORDER, PERFORMED IN ~~LOC~~ HOSPITAL LAB): SARS Coronavirus 2: NEGATIVE

## 2018-12-12 MED ORDER — SODIUM CHLORIDE 0.9 % IV SOLN
10.0000 mL/h | Freq: Once | INTRAVENOUS | Status: AC
  Administered 2018-12-12: 10 mL/h via INTRAVENOUS

## 2018-12-12 MED ORDER — SODIUM CHLORIDE 0.9% IV SOLUTION
Freq: Once | INTRAVENOUS | Status: AC
  Administered 2018-12-13: 02:00:00 via INTRAVENOUS
  Filled 2018-12-12: qty 250

## 2018-12-12 NOTE — H&P (Signed)
[facility] [place] NAME: Raymond Erickson    MR#:  [id number]  DATE OF BIRTH:  Jun 27, 1955  DATE OF ADMISSION:  12/12/2018  PRIMARY CARE PHYSICIAN: Baxter Hire, MD   REQUESTING/REFERRING PHYSICIAN: Lenise Arena, MD  CHIEF COMPLAINT:   Chief Complaint  Patient presents with   Abnormal Lab    HISTORY OF PRESENT ILLNESS:  Raymond Erickson  is a 63 y.o. male with a known history of cirrhosis, anemia, prior history of alcohol abuse with cessation greater than 4 years ago, gout, hyperlipidemia, hypertension, COPD.  He was sent to the emergency room by his primary care physician for hemoglobin of 6.  On arrival hemoglobin is 5.9 with hematocrit 23.7 and platelet count 52.  He received 1 unit packed red blood cells in the emergency room.  He is receiving second unit packed red blood cells currently.  Patient reports he has noted some malaise and fatigue as well as dizziness and lightheadedness at times.  He denies abdominal pain.  He denies nausea or vomiting.  He denies black or tarry stools.  He denies blood in his stool.  He had an episode of hematuria greater than 18 months ago.  He denies current hematuria.  He denies hematemesis, hematochezia, or melena.  He denies chest pain or shortness of breath.  He denies fever, chills.  He denies taking aspirin or other NSAIDs.  He has been seen at [facility] for similar episode last year in 2019, November.  Patient reports he had EGD and colonoscopy at that time with no abnormal findings.  However, patient reports he was diagnosed with liver cirrhosis during that evaluation.  Rapid COVID-19 testing is negative  He has now been admitted to the hospitalist service for further management.  PAST MEDICAL HISTORY:   Past Medical History:  Diagnosis Date   Alcohol abuse    Arthritis    Cirrhosis of liver ([place])    COPD with asthma ([place])    Essential tremor    Gout    Gout    Hematuria     HLD (hyperlipidemia)    HTN (hypertension)     PAST SURGICAL HISTORY:   Past Surgical History:  Procedure Laterality Date   broken nose     HERNIA REPAIR      SOCIAL HISTORY:   Social History   Tobacco Use   Smoking status: Current Every Day Smoker   Smokeless tobacco: Never Used  Substance Use Topics   Alcohol use: Yes    Alcohol/week: 100.0 standard drinks    Types: 100 Standard drinks or equivalent per week    FAMILY HISTORY:   Family History  Problem Relation Age of Onset   Cirrhosis Brother     DRUG ALLERGIES:   Allergies  Allergen Reactions   Ace Inhibitors Swelling   Lisinopril Swelling   Spironolactone Other (See Comments)    gynecomastia    REVIEW OF SYSTEMS:   Review of Systems  Constitutional: Positive for malaise/fatigue. Negative for chills and fever.  HENT: Negative for congestion, hearing loss, sinus pain and sore throat.   Eyes: Negative for blurred vision and double vision.  Respiratory: Negative for cough, shortness of breath and wheezing.   Cardiovascular: Negative for chest pain, palpitations and orthopnea.  Gastrointestinal: Negative for abdominal pain, blood in stool, constipation, diarrhea, heartburn, melena, nausea and vomiting.  Genitourinary: Negative for dysuria, flank pain and hematuria.  Musculoskeletal: Negative for falls, joint pain and myalgias.  Neurological: Positive for dizziness. Negative for sensory change, speech change, focal weakness, loss of consciousness and headaches.  Psychiatric/Behavioral: Negative.  Negative for depression.    MEDICATIONS AT HOME:   Prior to Admission medications   Medication Sig Start Date End Date Taking? Authorizing Provider  amLODipine (NORVASC) 5 MG tablet Take 0.5 tablets (2.5 mg total) by mouth daily. 12/29/17  Yes Mody, Ulice Bold, MD  folic acid (FOLVITE) 1 MG tablet Take 1 mg by mouth daily. 12/07/17  Yes [provider]  gabapentin (NEURONTIN) 100 MG capsule Take  100 mg by mouth 3 (three) times daily. 07/18/18 07/18/19 Yes [provider]  omeprazole (PRILOSEC) 20 MG capsule Take 20 mg by mouth 2 (two) times daily. 07/22/16  Yes [provider]  propranolol (INDERAL) 40 MG tablet Take 40 mg by mouth 2 (two) times daily.  01/17/14  Yes [provider]  allopurinol (ZYLOPRIM) 100 MG tablet Take 100 mg by mouth 2 (two) times daily.     [provider]  blood glucose meter kit and supplies KIT Dispense based on patient and insurance preference. Use up to four times daily as directed. (FOR ICD-9 250.00, 250.01). 12/29/17   Bettey Costa, MD  clonazePAM (KLONOPIN) 0.5 MG tablet Take 0.5 mg by mouth 2 (two) times daily as needed for anxiety.  01/21/15   [provider]  CONSTULOSE 10 GM/15ML solution Take 30 mLs by mouth 3 (three) times daily. 12/03/18   [provider]  insulin aspart (NOVOLOG) 100 UNIT/ML injection Inject 0-15 Units into the skin 3 (three) times daily with meals. CBG 70 - 120: 0 units  CBG 121 - 150: 2 units  CBG 151 - 200: 3 units  CBG 201 - 250: 5 units  CBG 251 - 300: 8 units  CBG 301 - 350: 11 units  CBG 351 - 400: 15 units  CBG > 400 call MD Patient not taking: Reported on 12/12/2018 12/30/17   Bettey Costa, MD  insulin aspart (NOVOLOG) 100 unit/mL injection Inject 8 Units into the skin 3 (three) times daily before meals. Patient not taking: Reported on 12/12/2018 12/30/17   Bettey Costa, MD  insulin aspart (NOVOLOG) 100 UNIT/ML injection Inject 0-5 Units into the skin at bedtime. CBG 70 - 120: 0 units  CBG 121 - 150: 0 units  CBG 151 - 200: 0 units  CBG 201 - 250: 2 units  CBG 251 - 300: 3 units  CBG 301 - 350: 4 units  CBG 351 - 400: 5 units  CBG > 400 call MD Patient not taking: Reported on 12/12/2018 12/30/17   Bettey Costa, MD  insulin glargine (LANTUS) 100 UNIT/ML injection Inject 0.35 mLs (35 Units total) into the skin daily. Patient not taking: Reported on 12/12/2018 12/31/17   Bettey Costa, MD    nicotine (NICODERM CQ - DOSED IN MG/24 HOURS) 14 mg/24hr patch Place 1 patch (14 mg total) onto the skin daily. Patient not taking: Reported on 12/12/2018 12/29/17   Bettey Costa, MD      VITAL SIGNS:  Blood pressure (!) 173/88, pulse (!) 56, temperature 97.7 F (36.5 C), temperature source Oral, resp. rate 18, height _0  (1.854 m), weight 120.2 kg, SpO2 100 %.  PHYSICAL EXAMINATION:  Physical Exam  GENERAL:  63 y.o.-year-old patient lying in the bed with no acute distress.  EYES: Pupils equal, round, reactive to light and accommodation. No scleral icterus. Extraocular muscles intact.  HEENT: Head atraumatic, normocephalic. Oropharynx and nasopharynx clear.  NECK:  Supple, no jugular venous distention. No thyroid enlargement, no tenderness.  LUNGS: Normal breath sounds bilaterally, no wheezing, rales,rhonchi.  No use of accessory muscles of respiration.  CARDIOVASCULAR: Regular rate and rhythm, S1, S2 normal. No murmurs, rubs, or gallops.  ABDOMEN: Soft, nondistended, nontender. Bowel sounds present. No organomegaly or mass.  EXTREMITIES: No pedal edema, cyanosis, or clubbing.  NEUROLOGIC: Cranial nerves II through XII are intact. Muscle strength 5/5 in all extremities. Sensation intact. Gait not checked.  PSYCHIATRIC: The patient is alert and oriented x 3.  Normal affect and good eye contact. SKIN: No obvious rash, lesion, or ulcer.   LABORATORY PANEL:   CBC Recent Labs  Lab 12/12/18 1500  WBC 6.2  HGB 5.9*  HCT 23.7*  PLT 52*   ------------------------------------------------------------------------------------------------------------------  Chemistries  Recent Labs  Lab 12/12/18 1500  NA 142  K 4.0  CL 113*  CO2 22  GLUCOSE 105*  BUN 10  CREATININE 0.97  CALCIUM 8.5*  AST 21  ALT 11  ALKPHOS 93  BILITOT 0.3   ------------------------------------------------------------------------------------------------------------------  Cardiac Enzymes No results for  input(s): TROPONINI in the last 168 hours. ------------------------------------------------------------------------------------------------------------------  RADIOLOGY:  No results found.    IMPRESSION AND PLAN:   1.  Symptomatic anemia - Patient is receiving second unit packed red blood cells since arrival to the emergency room -We will repeat hemoglobin hematocrit 1 hour posttransfusion - Dr. Vicente Males with gastroenterology has been consulted for further evaluation -Patient will be n.p.o. after midnight for possible intervention - We will get iron studies and liver profile as patient has a history of liver cirrhosis-stool for occult blood  2.  Hypertension -Norvasc restarted --We will treat persistent hypertension with IV hydralazine - Amatory monitoring  3.  GERD - PPI therapy initiated  4.  Diabetes mellitus -Moderate sliding scale insulin  5.  History of alcohol abuse - Patient reports no alcohol abuse for greater than 5 years  DVT prophylaxis with SCDs and PPI therapy initiated   All the records are reviewed and case discussed with ED provider. The plan of care was discussed in details with the patient (and family). I answered all questions. The patient agreed to proceed with the above mentioned plan. Further management will depend upon hospital course.   CODE STATUS: Full code  TOTAL TIME TAKING CARE OF THIS PATIENT: 45 minutes.    [place] on 12/12/2018 at 11:00 PM  Pager - [phone]  After 6pm go to [email or web address] - Proofreader  [facility]  Office  [phone]  CC: Primary care physician; Baxter Hire, MD   Note: This dictation was prepared with Dragon dictation along with smaller phrase technology. Any transcriptional errors that result from this process are unintentional.

## 2018-12-12 NOTE — ED Notes (Signed)
Amy RN attempted secondary IV access x1

## 2018-12-12 NOTE — ED Notes (Signed)
ED TO INPATIENT HANDOFF REPORT  ED Nurse Name and Phone #: Betti CruzKate  S Name/Age/Gender Raymond Erickson 63 y.o. male Room/Bed: ED09A/ED09A  Code Status   Code Status: Prior  Home/SNF/Other Home Patient oriented to: self, place, time and situation Is this baseline? Yes   Triage Complete: Triage complete  Chief Complaint abnormal lab  Triage Note Patient states he was called today by his doctor and told his hemoglobin was 6.  Patient states he has had this issue before.  Patient reports having an endoscopy last year with no conclusive results.  Patient's mucous membranes appear pale.  Patient denies feeling weak/tired.  Patient reports a few episodes of feeling, "light headed" last week.  Patient states he feels fine today. Patient denies black/tarry stools.      Allergies Allergies  Allergen Reactions  . Ace Inhibitors Swelling  . Lisinopril Swelling  . Spironolactone Other (See Comments)    gynecomastia    Level of Care/Admitting Diagnosis ED Disposition    ED Disposition Condition Comment   Admit  The patient appears reasonably stabilized for admission considering the current resources, flow, and capabilities available in the ED at this time, and I doubt any other [facility] requiring further screening and/or treatment in the ED prior to admission is  present.       B Medical/Surgery History Past Medical History:  Diagnosis Date  . Alcohol abuse   . Arthritis   . Cirrhosis of liver (HCC)   . COPD with asthma (HCC)   . Essential tremor   . Gout   . Gout   . Hematuria   . HLD (hyperlipidemia)   . HTN (hypertension)    Past Surgical History:  Procedure Laterality Date  . broken nose    . HERNIA REPAIR       A IV Location/Drains/Wounds Patient Lines/Drains/Airways Status   Active Line/Drains/Airways    Name:   Placement date:   Placement time:   Site:   Days:   Peripheral IV 12/12/18 Left Antecubital   12/12/18    1727    Antecubital   less than 1   Peripheral IV 12/12/18 Anterior;Left;Upper Arm   12/12/18    1840    Arm   less than 1          Intake/Output Last 24 hours  Intake/Output Summary (Last 24 hours) at 12/12/2018 2347 Last data filed at 12/12/2018 2108 Gross per 24 hour  Intake 930 ml  Output -  Net 930 ml    Labs/Imaging Results for orders placed or performed during the hospital encounter of 12/12/18 (from the past 48 hour(s))  Comprehensive metabolic panel     Status: Abnormal   Collection Time: 12/12/18  3:00 PM  Result Value Ref Range   Sodium 142 135 - 145 mmol/L   Potassium 4.0 3.5 - 5.1 mmol/L   Chloride 113 (H) 98 - 111 mmol/L   CO2 22 22 - 32 mmol/L   Glucose, Bld 105 (H) 70 - 99 mg/dL   BUN 10 6 - 20 mg/dL   Creatinine, Ser 1.610.97 0.61 - 1.24 mg/dL   Calcium 8.5 (L) 8.9 - 10.3 mg/dL   Total Protein 7.1 6.5 - 8.1 g/dL   Albumin 3.5 3.5 - 5.0 g/dL   AST 21 15 - 41 U/L   ALT 11 0 - 44 U/L   Alkaline Phosphatase 93 38 - 126 U/L   Total Bilirubin 0.3 0.3 - 1.2 mg/dL   GFR calc non Af Amer >60 >  60 mL/min   GFR calc Af Amer >60 >60 mL/min   Anion gap 7 5 - 15    Comment: Performed at [facility], [address] [place], [place] [address]  CBC     Status: Abnormal   Collection Time: 12/12/18  3:00 PM  Result Value Ref Range   WBC 6.2 4.0 - 10.5 K/uL   RBC 4.00 (L) 4.22 - 5.81 MIL/uL   Hemoglobin 5.9 (L) 13.0 - 17.0 g/dL    Comment: Reticulocyte Hemoglobin testing may be clinically indicated, consider ordering this additional test [id number]    HCT 23.7 (L) 39.0 - 52.0 %   MCV 59.3 (L) 80.0 - 100.0 fL   MCH 14.8 (L) 26.0 - 34.0 pg   MCHC 24.9 (L) 30.0 - 36.0 g/dL   RDW 11.921.6 (H) 14.711.5 - 82.915.5 %   Platelets 52 (L) 150 - 400 K/uL    Comment: REPEATED TO VERIFY PLATELET COUNT CONFIRMED BY SMEAR SPECIMEN CHECKED FOR CLOTS Immature Platelet Fraction may be clinically indicated, consider ordering this additional test [id number]    nRBC 0.0 0.0 - 0.2 %    Comment: Performed at [facility], [address] [place], [place] [address]  Type and screen [facility]     Status: None (Preliminary result)   Collection Time: 12/12/18  3:00 PM  Result Value Ref Range   ABO/RH(D) O POS    Antibody Screen NEG    Sample Expiration 12/15/2018,2359    Unit Number [id number]    Blood Component Type RBC LR PHER1    Unit division 00    Status of Unit ISSUED    Transfusion Status OK TO TRANSFUSE    Crossmatch Result      Compatible Performed at [facility], [address] [place], [place] [address]   Prepare RBC     Status: None   Collection Time: 12/12/18  6:00 PM  Result Value Ref Range   Order Confirmation      ORDER PROCESSED BY BLOOD BANK Performed at [facility], [address] [place], [place] [address]   SARS Coronavirus 2 (CEPHEID - Performed in [facility] hospital lab), Hosp Order     Status: None   Collection Time: 12/12/18  6:33 PM   Specimen: Nasopharyngeal Swab  Result Value Ref Range   SARS Coronavirus 2 NEGATIVE NEGATIVE    Comment: (NOTE) If result is NEGATIVE SARS-CoV-2 target nucleic acids are NOT DETECTED. The SARS-CoV-2 RNA is generally detectable in upper and lower  respiratory specimens during the acute phase of infection. The lowest  concentration of SARS-CoV-2 viral copies this assay can detect is 250  copies / mL. A negative result does not preclude SARS-CoV-2 infection  and should not be used as the sole basis for treatment or other  patient management decisions.  A negative result may occur with  improper specimen collection / handling, submission of specimen other  than nasopharyngeal swab, presence of viral mutation(s) within the  areas targeted by this assay, and inadequate number of viral copies  (<250 copies / mL). A negative result must be combined with clinical  observations, patient history, and epidemiological information. If result is POSITIVE SARS-CoV-2 target nucleic acids are  DETECTED. The SARS-CoV-2 RNA is generally detectable in upper and lower  respiratory specimens dur ing the acute phase of infection.  Positive  results are indicative of active infection with SARS-CoV-2.  Clinical  correlation with patient history and other diagnostic information  is  necessary to determine patient infection status.  Positive results do  not rule out bacterial infection or co-infection with other viruses. If result is PRESUMPTIVE POSTIVE SARS-CoV-2 nucleic acids MAY BE PRESENT.   A presumptive positive result was obtained on the submitted specimen  and confirmed on repeat testing.  While 2019 novel coronavirus  (SARS-CoV-2) nucleic acids may be present in the submitted sample  additional confirmatory testing may be necessary for epidemiological  and / or clinical management purposes  to differentiate between  SARS-CoV-2 and other Sarbecovirus currently known to infect humans.  If clinically indicated additional testing with an alternate test  methodology [phone]([id number]) is advised. The SARS-CoV-2 RNA is generally  detectable in upper and lower respiratory sp ecimens during the acute  phase of infection. The expected result is Negative. Fact Sheet for Patients:  BoilerBrush.com.cyhttps://www.fda.gov/media/136312/download Fact Sheet for Healthcare Providers: https://pope.com/https://www.fda.gov/media/136313/download This test is not yet approved or cleared by the [place] FDA and has been authorized for detection and/or diagnosis of SARS-CoV-2 by FDA under an Emergency Use Authorization (EUA).  This EUA will remain in effect (meaning this test can be used) for the duration of the COVID-19 declaration under Section 564(b)(1) of the Act, 21 U.S.C. section 360bbb-3(b)(1), unless the authorization is terminated or revoked sooner. Performed at [facility], [address] [place], [place] [address]    No results found.  Pending Labs Unresulted Labs (From admission, onward)    Start      Ordered   12/13/18 2344  Hemoglobin and hematocrit, blood  Once,   STAT    Comments: AFTER TRANSFUSION COMPLETE    12/12/18 2344   12/12/18 2345  Hepatic function panel  Once,   STAT     12/12/18 2344   12/12/18 2345  Iron and TIBC  Once,   STAT     12/12/18 2344   12/12/18 2344  ABO/Rh  Once,   STAT     12/12/18 2343   12/12/18 2344  Prepare RBC  (Adult Blood Administration - Red Blood Cells)  Once,   R    Question Answer Comment  # of Units 1 unit   Transfusion Indications Symptomatic Anemia   If emergent release call blood bank Not emergent release   Instructions: Transfuse      12/12/18 2343   12/12/18 1750  Occult blood card to lab, stool  Once,   STAT     12/12/18 1749   Signed and Held  Basic metabolic panel  Tomorrow morning,   R     Signed and Held   Signed and Held  CBC  Tomorrow morning,   R     Signed and Held   Signed and Held  Protime-INR  Tomorrow morning,   R     Signed and Held   Signed and Held  Occult blood card to lab, stool RN will collect  Once,   R    Question:  Specimen to be collected by?  Answer:  RN will collect   Signed and Held          Vitals/Pain Today's Vitals   12/12/18 2030 12/12/18 2100 12/12/18 2108 12/12/18 2342  BP: (!) 176/92 (!) 175/84 (!) 173/88 (!) 172/92  Pulse: (!) 56 (!) 51 (!) 56 (!) 54  Resp: 18 16 18 20   Temp:   97.7 F (36.5 C)   TempSrc:   Oral   SpO2: 100% 100% 100% 98%  Weight:      Height:  PainSc:        Isolation Precautions No active isolations  Medications Medications  0.9 %  sodium chloride infusion (Manually program via Guardrails IV Fluids) (has no administration in time range)  0.9 %  sodium chloride infusion (0 mL/hr Intravenous Stopped 12/12/18 2109)    Mobility walks Low fall risk   Focused Assessments Cardiac Assessment Handoff:  Cardiac Rhythm: Normal sinus rhythm Lab Results  Component Value Date   TROPONINI 0.03 11/05/2015   No results found for: DDIMER Does the Patient  currently have chest pain? No     R Recommendations: See Admitting Provider Note  Report given to:   Additional Notes:

## 2018-12-12 NOTE — ED Triage Notes (Signed)
Patient states he was called today by his doctor and told his hemoglobin was 6.  Patient states he has had this issue before.  Patient reports having an endoscopy last year with no conclusive results.  Patient's mucous membranes appear pale.  Patient denies feeling weak/tired.  Patient reports a few episodes of feeling, "light headed" last week.  Patient states he feels fine today. Patient denies black/tarry stools.

## 2018-12-12 NOTE — ED Notes (Signed)
Attempted secondary iv access x1

## 2018-12-12 NOTE — ED Notes (Signed)
PT has cell phone and has spoke to his wife

## 2018-12-12 NOTE — ED Provider Notes (Signed)
[facility] Emergency Department Provider Note       Time seen: ----------------------------------------- 5:20 PM on 12/12/2018 -----------------------------------------   I have reviewed the triage vital signs and the nursing notes.  HISTORY   Chief Complaint Abnormal Lab   HPI Raymond Erickson is a 63 y.o. male with a history of alcohol abuse, cirrhosis, COPD, gout, hematuria, hyperlipidemia, hypertension who presents to the ED for low hemoglobin.  Patient was called by his doctor today and told his hemoglobin was 6.  Reports he has had this issue before, he had an endoscopy last year with no conclusive results.  He does report feeling weak and tired and some episodes of lightheadedness.  He denies black or tarry stools.  Past Medical History:  Diagnosis Date  . Alcohol abuse   . Arthritis   . Cirrhosis of liver (HCC)   . COPD with asthma (HCC)   . Essential tremor   . Gout   . Gout   . Hematuria   . HLD (hyperlipidemia)   . HTN (hypertension)     Patient Active Problem List   Diagnosis Date Noted  . DKA (diabetic ketoacidoses) (HCC) 12/28/2017  . Chronic obstructive pulmonary disease (HCC)   . Tobacco abuse   . Sinus tachycardia   . Hypotension   . Hematuria   . Acute renal failure (HCC)   . ETOH abuse   . Alcoholic cirrhosis of liver with ascites (HCC)   . Delirium tremens (HCC)   . Hemoperitoneum 11/05/2015  . Gross hematuria 08/29/2015  . 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 History of elevated PSA 08/29/2015  . Other hemorrhoids 08/29/2015  . Elevated prostate specific antigen (PSA) 04/26/2014  . AA (alcohol abuse) 04/18/2014  . 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 Injury of kidney 04/15/2014  . Brain disorder 04/15/2014  . 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 Gout 04/15/2014  . Decreased potassium in the blood 04/15/2014  . Hypomagnesemia 04/15/2014  . Thrombocytopenia (HCC) 04/15/2014  .  Disorder of hip region 12/12/2013  . Airway hyperreactivity 11/22/2013  . BP (high blood pressure) 11/22/2013  . HLD (hyperlipidemia) 11/22/2013    Past Surgical History:  Procedure Laterality Date  . broken nose    . HERNIA REPAIR      Allergies Ace inhibitors and Lisinopril  Social History Social History   Tobacco Use  . Smoking status: Current Every Day Smoker  . Smokeless tobacco: Never Used  Substance Use Topics  . Alcohol use: Yes    Alcohol/week: 100.0 standard drinks    Types: 100 Standard drinks or equivalent per week  . Drug use: No   Review of Systems Constitutional: Negative for fever. Cardiovascular: Negative for chest pain. Respiratory: Negative for shortness of breath. Gastrointestinal: Negative for abdominal pain, vomiting and diarrhea. Musculoskeletal: Negative for back pain. Skin: Negative for rash. Neurological: Positive for weakness  All systems negative/normal/unremarkable except as stated in the HPI  ____________________________________________   PHYSICAL EXAM:  VITAL SIGNS: ED Triage Vitals  Enc Vitals Group     BP 12/12/18 1451 (!) 169/93     Pulse Rate 12/12/18 1451 65     Resp 12/12/18 1451 16     Temp 12/12/18 1451 98.1 F (36.7 C)     Temp Source 12/12/18 1451 Oral     SpO2 12/12/18 1451 100 %     Weight 12/12/18 1452 265 lb (120.2 kg)     Height 12/12/18 1452 6\' 1"  (1.854 m)  Head Circumference --      Peak Flow --      Pain Score 12/12/18 1452 0     Pain Loc --      Pain Edu? --      Excl. in GC? --    Constitutional: Alert and oriented. Well appearing and in no distress. Eyes: Conjunctivae are pale.  Normal extraocular movements. ENT      Head: Normocephalic and atraumatic.      Nose: No congestion/rhinnorhea.      Mouth/Throat: Mucous membranes are moist.      Neck: No stridor. Cardiovascular: Normal rate, regular rhythm. No murmurs, rubs, or gallops. Respiratory: Normal respiratory effort without tachypnea nor  retractions. Breath sounds are clear and equal bilaterally. No wheezes/rales/rhonchi. Gastrointestinal: Soft and nontender. Normal bowel sounds Musculoskeletal: Nontender with normal range of motion in extremities. No lower extremity tenderness nor edema. Neurologic:  Normal speech and language. No gross focal neurologic deficits are appreciated.  Skin:  Skin is warm, dry and intact.  Pallor is noted Psychiatric: Mood and affect are normal. Speech and behavior are normal.  ____________________________________________  ED COURSE:  As part of my medical decision making, I reviewed the following data within the electronic MEDICAL RECORD NUMBER History obtained from family if available, nursing notes, old chart and ekg, as well as notes from prior ED visits. Patient presented for anemia, we will assess with labs as indicated at this time.   Procedures  Raymond BoozeGeorge Heimann Erickson was evaluated in Emergency Department on 12/12/2018 for the symptoms described in the history of present illness. He was evaluated in the context of the global COVID-19 pandemic, which necessitated consideration that the patient might be at risk for infection with the SARS-CoV-2 virus that causes COVID-19. Institutional protocols and algorithms that pertain to the evaluation of patients at risk for COVID-19 are in a state of rapid change based on information released by regulatory bodies including the CDC and federal and state organizations. These policies and algorithms were followed during the patient's care in the ED.  ____________________________________________   LABS (pertinent positives/negatives)  Labs Reviewed  COMPREHENSIVE METABOLIC PANEL - Abnormal; Notable for the following components:      Result Value   Chloride 113 (*)    Glucose, Bld 105 (*)    Calcium 8.5 (*)    All other components within normal limits  CBC - Abnormal; Notable for the following components:   RBC 4.00 (*)    Hemoglobin 5.9 (*)    HCT 23.7 (*)     MCV 59.3 (*)    MCH 14.8 (*)    MCHC 24.9 (*)    RDW 21.6 (*)    Platelets 52 (*)    All other components within normal limits  POC OCCULT BLOOD, ED  TYPE AND SCREEN  ____________________________________________   CRITICAL CARE Performed by: Ulice DashJohnathan E    Total critical care time: 30 minutes  Critical care time was exclusive of separately billable procedures and treating other patients.  Critical care was necessary to treat or prevent imminent or life-threatening deterioration.  Critical care was time spent personally by me on the following activities: development of treatment plan with patient and/or surrogate as well as nursing, discussions with consultants, evaluation of patient's response to treatment, examination of patient, obtaining history from patient or surrogate, ordering and performing treatments and interventions, ordering and review of laboratory studies, ordering and review of radiographic studies, pulse oximetry and re-evaluation of patient's condition.   DIFFERENTIAL DIAGNOSIS  Anemia, occult GI bleeding, cirrhosis  FINAL ASSESSMENT AND PLAN  Anemia, thrombocytopenia   Plan: The patient had presented for worsening anemia. Patient's labs reveal hemoglobin of 5.9 and chronic thrombocytopenia.  He has not seen any blood in his stool or urine.  No clear etiology for his anemia at this time.  He has consented to a unit of blood.  I will discuss with the hospitalist for admission.   Laurence Aly, MD    Note: This note was generated in part or whole with voice recognition software. Voice recognition is usually quite accurate but there are transcription errors that can and very often do occur. I apologize for any typographical errors that were not detected and corrected.     Earleen Newport, MD 12/12/18 [phone]

## 2018-12-13 ENCOUNTER — Other Ambulatory Visit: Payer: Self-pay

## 2018-12-13 DIAGNOSIS — K922 Gastrointestinal hemorrhage, unspecified: Secondary | ICD-10-CM | POA: Diagnosis present

## 2018-12-13 DIAGNOSIS — F17200 Nicotine dependence, unspecified, uncomplicated: Secondary | ICD-10-CM | POA: Diagnosis present

## 2018-12-13 DIAGNOSIS — K219 Gastro-esophageal reflux disease without esophagitis: Secondary | ICD-10-CM | POA: Diagnosis present

## 2018-12-13 DIAGNOSIS — Z79899 Other long term (current) drug therapy: Secondary | ICD-10-CM | POA: Diagnosis not present

## 2018-12-13 DIAGNOSIS — E785 Hyperlipidemia, unspecified: Secondary | ICD-10-CM | POA: Diagnosis present

## 2018-12-13 DIAGNOSIS — Z5329 Procedure and treatment not carried out because of patient's decision for other reasons: Secondary | ICD-10-CM | POA: Diagnosis not present

## 2018-12-13 DIAGNOSIS — K7460 Unspecified cirrhosis of liver: Secondary | ICD-10-CM | POA: Diagnosis present

## 2018-12-13 DIAGNOSIS — M109 Gout, unspecified: Secondary | ICD-10-CM | POA: Diagnosis present

## 2018-12-13 DIAGNOSIS — J449 Chronic obstructive pulmonary disease, unspecified: Secondary | ICD-10-CM | POA: Diagnosis present

## 2018-12-13 DIAGNOSIS — I10 Essential (primary) hypertension: Secondary | ICD-10-CM | POA: Diagnosis present

## 2018-12-13 DIAGNOSIS — D649 Anemia, unspecified: Secondary | ICD-10-CM | POA: Diagnosis present

## 2018-12-13 DIAGNOSIS — E119 Type 2 diabetes mellitus without complications: Secondary | ICD-10-CM | POA: Diagnosis present

## 2018-12-13 DIAGNOSIS — Z1159 Encounter for screening for other viral diseases: Secondary | ICD-10-CM | POA: Diagnosis not present

## 2018-12-13 DIAGNOSIS — G250 Essential tremor: Secondary | ICD-10-CM | POA: Diagnosis present

## 2018-12-13 DIAGNOSIS — D696 Thrombocytopenia, unspecified: Secondary | ICD-10-CM | POA: Diagnosis present

## 2018-12-13 DIAGNOSIS — Z794 Long term (current) use of insulin: Secondary | ICD-10-CM | POA: Diagnosis not present

## 2018-12-13 LAB — CBC
HCT: 27.2 % — ABNORMAL LOW (ref 39.0–52.0)
Hemoglobin: 7.4 g/dL — ABNORMAL LOW (ref 13.0–17.0)
MCH: 16.8 pg — ABNORMAL LOW (ref 26.0–34.0)
MCHC: 27.2 g/dL — ABNORMAL LOW (ref 30.0–36.0)
MCV: 61.7 fL — ABNORMAL LOW (ref 80.0–100.0)
Platelets: 48 10*3/uL — ABNORMAL LOW (ref 150–400)
RBC: 4.41 MIL/uL (ref 4.22–5.81)
RDW: 25.8 % — ABNORMAL HIGH (ref 11.5–15.5)
WBC: 6.4 10*3/uL (ref 4.0–10.5)
nRBC: 0 % (ref 0.0–0.2)

## 2018-12-13 LAB — BASIC METABOLIC PANEL
Anion gap: 8 (ref 5–15)
BUN: 11 mg/dL (ref 6–20)
CO2: 21 mmol/L — ABNORMAL LOW (ref 22–32)
Calcium: 8.5 mg/dL — ABNORMAL LOW (ref 8.9–10.3)
Chloride: 110 mmol/L (ref 98–111)
Creatinine, Ser: 0.86 mg/dL (ref 0.61–1.24)
GFR calc Af Amer: >60 mL/min (ref >60)
GFR calc non Af Amer: >60 mL/min (ref >60)
Glucose, Bld: 98 mg/dL (ref 70–99)
Potassium: 3.7 mmol/L (ref 3.5–5.1)
Sodium: 139 mmol/L (ref 135–145)

## 2018-12-13 LAB — HEPATIC FUNCTION PANEL
ALT: 14 U/L (ref 0–44)
AST: 28 U/L (ref 15–41)
Albumin: 3.8 g/dL (ref 3.5–5.0)
Alkaline Phosphatase: 90 U/L (ref 38–126)
Bilirubin, Direct: 0.2 mg/dL (ref 0.0–0.2)
Indirect Bilirubin: 0.5 mg/dL (ref 0.3–0.9)
Total Bilirubin: 0.7 mg/dL (ref 0.3–1.2)
Total Protein: 7.5 g/dL (ref 6.5–8.1)

## 2018-12-13 LAB — PROTIME-INR
INR: 1.1 (ref 0.8–1.2)
Prothrombin Time: 14.4 seconds (ref 11.4–15.2)

## 2018-12-13 LAB — IRON AND TIBC
Iron: 19 ug/dL — ABNORMAL LOW (ref 45–182)
Saturation Ratios: 4 % — ABNORMAL LOW (ref 17.9–39.5)
TIBC: 544 ug/dL — ABNORMAL HIGH (ref 250–450)
UIBC: 525 ug/dL

## 2018-12-13 LAB — PREPARE RBC (CROSSMATCH)

## 2018-12-13 LAB — ABO/RH: ABO/RH(D): O POS

## 2018-12-13 MED ORDER — SODIUM CHLORIDE 0.9% FLUSH: 3.0000 mL | Freq: Two times a day (BID) | INTRAVENOUS | Status: DC

## 2018-12-13 MED ORDER — ONDANSETRON HCL 4 MG/2ML IJ SOLN: 4.0000 mg | Freq: Four times a day (QID) | INTRAMUSCULAR | Status: DC | PRN

## 2018-12-13 MED ORDER — SODIUM CHLORIDE 0.9 % IV SOLN: 250.0000 mL | INTRAVENOUS | Status: DC | PRN

## 2018-12-13 MED ORDER — SODIUM CHLORIDE 0.9% FLUSH: 3.0000 mL | INTRAVENOUS | Status: DC | PRN

## 2018-12-13 MED ORDER — GABAPENTIN 100 MG PO CAPS
100.0000 mg | ORAL_CAPSULE | Freq: Three times a day (TID) | ORAL | Status: DC
  Filled 2018-12-13: qty 1

## 2018-12-13 MED ORDER — PANTOPRAZOLE SODIUM 40 MG IV SOLR
40.0000 mg | Freq: Two times a day (BID) | INTRAVENOUS | Status: DC
  Filled 2018-12-13: qty 40

## 2018-12-13 MED ORDER — HYDRALAZINE HCL 20 MG/ML IJ SOLN
10.0000 mg | Freq: Three times a day (TID) | INTRAMUSCULAR | Status: DC | PRN
  Administered 2018-12-13: 10 mg via INTRAVENOUS
  Filled 2018-12-13: qty 1

## 2018-12-13 MED ORDER — FOLIC ACID 1 MG PO TABS
1.0000 mg | ORAL_TABLET | Freq: Every day | ORAL | Status: DC
  Filled 2018-12-13: qty 1

## 2018-12-13 MED ORDER — AMLODIPINE BESYLATE 5 MG PO TABS
2.5000 mg | ORAL_TABLET | Freq: Every day | ORAL | Status: DC
  Filled 2018-12-13: qty 1

## 2018-12-13 MED ORDER — ONDANSETRON HCL 4 MG PO TABS: 4.0000 mg | ORAL_TABLET | Freq: Four times a day (QID) | ORAL | Status: DC | PRN

## 2018-12-13 MED ORDER — PROPRANOLOL HCL 40 MG PO TABS
40.0000 mg | ORAL_TABLET | Freq: Two times a day (BID) | ORAL | Status: DC
  Filled 2018-12-13 (×2): qty 1

## 2018-12-13 MED ORDER — ALLOPURINOL 100 MG PO TABS
100.0000 mg | ORAL_TABLET | Freq: Two times a day (BID) | ORAL | Status: DC
  Filled 2018-12-13 (×2): qty 1

## 2018-12-13 NOTE — Progress Notes (Signed)
Patient prefers to leave AMA. We have discussed waiting for GI consult to determine if any further workup needed. Patients repeat hemoglobin was 7.4. Patient is asymptomatic.  I have informed the patient of the potential consequences of leaving AMA. Patient has signed form, escorted him to medical mall.

## 2018-12-14 LAB — TYPE AND SCREEN
ABO/RH(D): O POS
Antibody Screen: NEGATIVE
Unit division: 0
Unit division: 0

## 2018-12-14 LAB — BPAM RBC
Blood Product Expiration Date: 202008032359
Blood Product Expiration Date: 202008032359
ISSUE DATE / TIME: 202007061746
ISSUE DATE / TIME: 202007070230
Unit Type and Rh: 5100
Unit Type and Rh: 5100

## 2020-06-30 ENCOUNTER — Encounter: Payer: Self-pay | Admitting: Emergency Medicine

## 2020-06-30 ENCOUNTER — Emergency Department
Admission: EM | Admit: 2020-06-30 | Discharge: 2020-06-30 | Discharge status: Against Medical Advice | Payer: BC Managed Care – PPO | Attending: Emergency Medicine | Admitting: Emergency Medicine

## 2020-06-30 ENCOUNTER — Emergency Department: Payer: BC Managed Care – PPO

## 2020-06-30 ENCOUNTER — Other Ambulatory Visit: Payer: Self-pay

## 2020-06-30 DIAGNOSIS — Z20822 Contact with and (suspected) exposure to covid-19: Secondary | ICD-10-CM | POA: Diagnosis not present

## 2020-06-30 DIAGNOSIS — J449 Chronic obstructive pulmonary disease, unspecified: Secondary | ICD-10-CM | POA: Diagnosis not present

## 2020-06-30 DIAGNOSIS — E1165 Type 2 diabetes mellitus with hyperglycemia: Secondary | ICD-10-CM | POA: Insufficient documentation

## 2020-06-30 DIAGNOSIS — D649 Anemia, unspecified: Secondary | ICD-10-CM | POA: Insufficient documentation

## 2020-06-30 DIAGNOSIS — J189 Pneumonia, unspecified organism: Secondary | ICD-10-CM

## 2020-06-30 DIAGNOSIS — F17200 Nicotine dependence, unspecified, uncomplicated: Secondary | ICD-10-CM | POA: Diagnosis not present

## 2020-06-30 DIAGNOSIS — I10 Essential (primary) hypertension: Secondary | ICD-10-CM | POA: Insufficient documentation

## 2020-06-30 DIAGNOSIS — R739 Hyperglycemia, unspecified: Secondary | ICD-10-CM | POA: Diagnosis present

## 2020-06-30 DIAGNOSIS — Z79899 Other long term (current) drug therapy: Secondary | ICD-10-CM | POA: Insufficient documentation

## 2020-06-30 DIAGNOSIS — Z794 Long term (current) use of insulin: Secondary | ICD-10-CM | POA: Diagnosis not present

## 2020-06-30 LAB — HEPATIC FUNCTION PANEL
ALT: 18 U/L (ref 0–44)
AST: 27 U/L (ref 15–41)
Albumin: 3.5 g/dL (ref 3.5–5.0)
Alkaline Phosphatase: 90 U/L (ref 38–126)
Bilirubin, Direct: 0.1 mg/dL (ref 0.0–0.2)
Indirect Bilirubin: 0.7 mg/dL (ref 0.3–0.9)
Total Bilirubin: 0.8 mg/dL (ref 0.3–1.2)
Total Protein: 7.6 g/dL (ref 6.5–8.1)

## 2020-06-30 LAB — URINALYSIS, COMPLETE (UACMP) WITH MICROSCOPIC
Bacteria, UA: NONE SEEN
Bilirubin Urine: NEGATIVE
Glucose, UA: >=500 mg/dL — AB
Hgb urine dipstick: NEGATIVE
Ketones, ur: NEGATIVE mg/dL
Leukocytes,Ua: NEGATIVE
Nitrite: NEGATIVE
Protein, ur: NEGATIVE mg/dL
Specific Gravity, Urine: 1.028 (ref 1.005–1.030)
pH: 5 (ref 5.0–8.0)

## 2020-06-30 LAB — BASIC METABOLIC PANEL
Anion gap: 11 (ref 5–15)
BUN: 13 mg/dL (ref 8–23)
CO2: 23 mmol/L (ref 22–32)
Calcium: 8.7 mg/dL — ABNORMAL LOW (ref 8.9–10.3)
Chloride: 98 mmol/L (ref 98–111)
Creatinine, Ser: 0.95 mg/dL (ref 0.61–1.24)
GFR, Estimated: >60 mL/min (ref >60)
Glucose, Bld: 470 mg/dL — ABNORMAL HIGH (ref 70–99)
Potassium: 4.4 mmol/L (ref 3.5–5.1)
Sodium: 132 mmol/L — ABNORMAL LOW (ref 135–145)

## 2020-06-30 LAB — CBG MONITORING, ED: Glucose-Capillary: 444 mg/dL — ABNORMAL HIGH (ref 70–99)

## 2020-06-30 LAB — CBC
HCT: 26.7 % — ABNORMAL LOW (ref 39.0–52.0)
Hemoglobin: 6.3 g/dL — ABNORMAL LOW (ref 13.0–17.0)
MCH: 13.3 pg — ABNORMAL LOW (ref 26.0–34.0)
MCHC: 23.6 g/dL — ABNORMAL LOW (ref 30.0–36.0)
MCV: 56.4 fL — ABNORMAL LOW (ref 80.0–100.0)
Platelets: 81 10*3/uL — ABNORMAL LOW (ref 150–400)
RBC: 4.73 MIL/uL (ref 4.22–5.81)
RDW: 23.4 % — ABNORMAL HIGH (ref 11.5–15.5)
WBC: 6.6 10*3/uL (ref 4.0–10.5)
nRBC: 0 % (ref 0.0–0.2)

## 2020-06-30 LAB — PROTIME-INR
INR: 1.2 (ref 0.8–1.2)
Prothrombin Time: 14.3 seconds (ref 11.4–15.2)

## 2020-06-30 LAB — APTT: aPTT: 32 seconds (ref 24–36)

## 2020-06-30 LAB — PREPARE RBC (CROSSMATCH)

## 2020-06-30 MED ORDER — FERROUS SULFATE 325 (65 FE) MG PO TBEC: 325.0000 mg | DELAYED_RELEASE_TABLET | Freq: Two times a day (BID) | ORAL | 0 refills | Status: DC

## 2020-06-30 MED ORDER — DOXYCYCLINE MONOHYDRATE 100 MG PO TABS
100.0000 mg | ORAL_TABLET | Freq: Two times a day (BID) | ORAL | 0 refills | Status: AC
Start: 2020-06-30 — End: 2020-07-07

## 2020-06-30 MED ORDER — GLIPIZIDE 5 MG PO TABS: 2.5000 mg | ORAL_TABLET | Freq: Every day | ORAL | 0 refills | Status: DC

## 2020-06-30 MED ORDER — DOXYCYCLINE HYCLATE 100 MG PO TABS
100.0000 mg | ORAL_TABLET | Freq: Once | ORAL | Status: AC
  Administered 2020-06-30: 100 mg via ORAL
  Filled 2020-06-30: qty 1

## 2020-06-30 MED ORDER — SODIUM CHLORIDE 0.9 % IV SOLN
10.0000 mL/h | Freq: Once | INTRAVENOUS | Status: AC
  Administered 2020-06-30: 10 mL/h via INTRAVENOUS

## 2020-06-30 MED ORDER — SODIUM CHLORIDE 0.9 % IV SOLN
1000.0000 mL | Freq: Once | INTRAVENOUS | Status: AC
  Administered 2020-06-30: 1000 mL via INTRAVENOUS

## 2020-06-30 NOTE — Discharge Instructions (Addendum)
You are leaving against medical advice. Your hemoglobin was low and it is unclear if that was from GI bleed or just chronic. We gave you one unit of blood.  Take iron and follow up as soon as possible for recheck with primary doctor.  For sugar take glipizide take 1/2 pill to start and follow up with primary doctor. You may need to be started back on insulin.   For your cough you may have the start of pneumonia. Take antibiotics and return to ER for worsening breathing. Your covid test is pending.   If you develop black stools, vomiting blood return to ER for recheck of hemoglobin

## 2020-06-30 NOTE — ED Triage Notes (Signed)
Pt presents via POV with hyperglycemia at home. Pt states glucometer at home was reading sugars over 500. Pt blood sugar 444 in triage. Pt was recently weaned off insulin per patient report. Pt c/o dry cough as well. Pt in NAD at this time.

## 2020-06-30 NOTE — ED Provider Notes (Signed)
Procedures     ----------------------------------------- 7:42 PM on 06/30/2020 -----------------------------------------  Transfusion completed. Pt feeling well.  Lungs CTAB. VSS. CXR shows small L base infiltrate - will start on doxy.  Reinforced need for iron supplement and glipizide for his anemia and uncontrolled diabetes. Still not willing to stay in hospital, will DC AMA.    Sharman Cheek, MD 06/30/20 1944

## 2020-06-30 NOTE — ED Provider Notes (Signed)
[facility] Emergency Department Provider Note  ____________________________________________   Event Date/Time   First MD Initiated Contact with Patient 06/30/20 1345     (approximate)  I have reviewed the triage vital signs and the nursing notes.   HISTORY  Chief Complaint Hyperglycemia    HPI Raymond Erickson is a 65 y.o. male with liver cirrhosis, alcohol abuse who comes in for elevated sugars.  Patient states that he has had elevated sugars.  He states that years ago he was on insulin but when he followed up with his primary care doctor they stated that he did not need to be on any more so has not been taking it for over 3 years.  He states that he does not have any of that medication or supplies still at home.  He does report having a cough that is mild.  He states that he was treated with steroids and antibiotics a month ago and the cough went away but now has come back for the past 10 days.  He denies any rectal bleeding or vomiting blood or abdominal pain or other concerns.  High sugars in the past few days, constant, nothing makes better, nothing makes it worse.  Patient does smoke cigarettes          Past Medical History:  Diagnosis Date  . Alcohol abuse   . Arthritis   . Cirrhosis of liver ([place])   . COPD with asthma ([place])   . Essential tremor   . Gout   . Gout   . Hematuria   . HLD (hyperlipidemia)   . HTN (hypertension)     Patient Active Problem List   Diagnosis Date Noted  . GI bleed 12/13/2018  . DKA (diabetic ketoacidoses) 12/28/2017  . Chronic obstructive pulmonary disease ([place])   . Tobacco abuse   . Sinus tachycardia   . Hypotension   . Hematuria   . Acute renal failure ([place])   . ETOH abuse   . Alcoholic cirrhosis of liver with ascites ([place])   . Delirium tremens ([place])   . Hemoperitoneum 11/05/2015  . Gross hematuria 08/29/2015  . 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 History of elevated PSA  08/29/2015  . Other hemorrhoids 08/29/2015  . Elevated prostate specific antigen (PSA) 04/26/2014  . AA (alcohol abuse) 04/18/2014  . 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 Injury of kidney 04/15/2014  . Brain disorder 04/15/2014  . 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 Gout 04/15/2014  . Decreased potassium in the blood 04/15/2014  . Hypomagnesemia 04/15/2014  . Thrombocytopenia ([place]) 04/15/2014  . Disorder of hip region 12/12/2013  . Airway hyperreactivity 11/22/2013  . BP (high blood pressure) 11/22/2013  . HLD (hyperlipidemia) 11/22/2013    Past Surgical History:  Procedure Laterality Date  . broken nose    . HERNIA REPAIR      Prior to Admission medications   Medication Sig Start Date End Date Taking? Authorizing Provider  allopurinol (ZYLOPRIM) 100 MG tablet Take 100 mg by mouth 2 (two) times daily.     [provider]  amLODipine (NORVASC) 5 MG tablet Take 0.5 tablets (2.5 mg total) by mouth daily. 12/29/17   Bettey Costa, MD  blood glucose meter kit and supplies KIT Dispense based on patient and insurance preference. Use up to four times daily as directed. (FOR ICD-9 250.00, 250.01). 12/29/17   Bettey Costa, MD  clonazePAM (KLONOPIN) 0.5 MG tablet Take 0.5 mg by mouth 2 (two) times daily  as needed for anxiety.  01/21/15   [provider]  CONSTULOSE 10 GM/15ML solution Take 30 mLs by mouth 3 (three) times daily. 12/03/18   [provider]  folic acid (FOLVITE) 1 MG tablet Take 1 mg by mouth daily. 12/07/17   [provider]  gabapentin (NEURONTIN) 100 MG capsule Take 100 mg by mouth 3 (three) times daily. 07/18/18 07/18/19  [provider]  insulin aspart (NOVOLOG) 100 UNIT/ML injection Inject 0-15 Units into the skin 3 (three) times daily with meals. CBG 70 - 120: 0 units  CBG 121 - 150: 2 units  CBG 151 - 200: 3 units  CBG 201 - 250: 5 units  CBG 251 - 300: 8 units  CBG 301 - 350: 11 units  CBG 351 - 400: 15 units  CBG > 400 call  MD Patient not taking: Reported on 12/12/2018 12/30/17   Bettey Costa, MD  insulin aspart (NOVOLOG) 100 unit/mL injection Inject 8 Units into the skin 3 (three) times daily before meals. Patient not taking: Reported on 12/12/2018 12/30/17   Bettey Costa, MD  insulin aspart (NOVOLOG) 100 UNIT/ML injection Inject 0-5 Units into the skin at bedtime. CBG 70 - 120: 0 units  CBG 121 - 150: 0 units  CBG 151 - 200: 0 units  CBG 201 - 250: 2 units  CBG 251 - 300: 3 units  CBG 301 - 350: 4 units  CBG 351 - 400: 5 units  CBG > 400 call MD Patient not taking: Reported on 12/12/2018 12/30/17   Bettey Costa, MD  insulin glargine (LANTUS) 100 UNIT/ML injection Inject 0.35 mLs (35 Units total) into the skin daily. Patient not taking: Reported on 12/12/2018 12/31/17   Bettey Costa, MD  nicotine (NICODERM CQ - DOSED IN MG/24 HOURS) 14 mg/24hr patch Place 1 patch (14 mg total) onto the skin daily. Patient not taking: Reported on 12/12/2018 12/29/17   Bettey Costa, MD  omeprazole (PRILOSEC) 20 MG capsule Take 20 mg by mouth 2 (two) times daily. 07/22/16   [provider]  propranolol (INDERAL) 40 MG tablet Take 40 mg by mouth 2 (two) times daily.  01/17/14   [provider]    Allergies Ace inhibitors, Lisinopril, and Spironolactone  Family History  Problem Relation Age of Onset  . Cirrhosis Brother     Social History Social History   Tobacco Use  . Smoking status: Current Every Day Smoker  . Smokeless tobacco: Never Used  Vaping Use  . Vaping Use: Never used  Substance Use Topics  . Alcohol use: Yes    Alcohol/week: 100.0 standard drinks    Types: 100 Standard drinks or equivalent per week  . Drug use: No      Review of Systems Constitutional: No fever/chills, high sugars Eyes: No visual changes. ENT: No sore throat. Cardiovascular: Denies chest pain. Respiratory: Denies shortness of breath.  Positive cough Gastrointestinal: No abdominal pain.  No nausea, no vomiting.  No diarrhea.  No  constipation. Genitourinary: Negative for dysuria. Musculoskeletal: Negative for back pain. Skin: Negative for rash. Neurological: Negative for headaches, focal weakness or numbness. All other ROS negative ____________________________________________   PHYSICAL EXAM:  VITAL SIGNS: ED Triage Vitals  Enc Vitals Group     BP 06/30/20 1232 (!) 133/99     Pulse Rate 06/30/20 1232 89     Resp 06/30/20 1232 18     Temp 06/30/20 1232 98.5 F (36.9 C)     Temp Source 06/30/20 1232 Oral  SpO2 06/30/20 1232 99 %     Weight 06/30/20 1240 266 lb 12.1 oz (121 kg)     Height 06/30/20 1240 6' 1"  (1.854 m)     Head Circumference --      Peak Flow --      Pain Score 06/30/20 1240 0     Pain Loc --      Pain Edu? --      Excl. in [place]? --     Constitutional: Alert and oriented. Well appearing and in no acute distress. Eyes: Conjunctivae are normal. EOMI. Head: Atraumatic. Nose: No congestion/rhinnorhea. Mouth/Throat: Mucous membranes are moist.   Neck: No stridor. Trachea Midline. FROM Cardiovascular: Normal rate, regular rhythm. Grossly normal heart sounds.  Good peripheral circulation. Respiratory: Normal respiratory effort.  No retractions. Lungs CTAB. Gastrointestinal: Soft and nontender. No distention. No abdominal bruits.  Musculoskeletal: No lower extremity tenderness nor edema.  No joint effusions. Neurologic:  Normal speech and language. No gross focal neurologic deficits are appreciated.  Skin:  Skin is warm, dry and intact. No rash noted. Psychiatric: Mood and affect are normal. Speech and behavior are normal. GU: Deferred   ____________________________________________   LABS (all labs ordered are listed, but only abnormal results are displayed)  Labs Reviewed  BASIC METABOLIC PANEL - Abnormal; Notable for the following components:      Result Value   Sodium 132 (*)    Glucose, Bld 470 (*)    Calcium 8.7 (*)    All other components within normal limits  CBC -  Abnormal; Notable for the following components:   Hemoglobin 6.3 (*)    HCT 26.7 (*)    MCV 56.4 (*)    MCH 13.3 (*)    MCHC 23.6 (*)    RDW 23.4 (*)    Platelets 81 (*)    All other components within normal limits  URINALYSIS, COMPLETE (UACMP) WITH MICROSCOPIC - Abnormal; Notable for the following components:   Color, Urine YELLOW (*)    APPearance CLEAR (*)    Glucose, UA >=500 (*)    All other components within normal limits  CBG MONITORING, ED - Abnormal; Notable for the following components:   Glucose-Capillary 444 (*)    All other components within normal limits  SARS CORONAVIRUS 2 (TAT 6-24 HRS)  PROTIME-INR  APTT  HEPATIC FUNCTION PANEL  CBG MONITORING, ED  CBG MONITORING, ED  PREPARE RBC (CROSSMATCH)  TYPE AND SCREEN   ____________________________________________   RADIOLOGY Robert Bellow, personally viewed and evaluated these images (plain radiographs) as part of my medical decision making, as well as reviewing the written report by the radiologist.  ED MD interpretation: Pending  Official radiology report(s): No results found.  ____________________________________________   PROCEDURES  Procedure(s) performed (including Critical Care):  .Critical Care Performed by: Vanessa Storla, MD Authorized by: Vanessa [place], MD   Critical care provider statement:    Critical care time (minutes):  35   Critical care was necessary to treat or prevent imminent or life-threatening deterioration of the following conditions: Symptomatic anemia with a hemoglobin less than 7.   Critical care was time spent personally by me on the following activities:  Discussions with consultants, evaluation of patient's response to treatment, examination of patient, ordering and performing treatments and interventions, ordering and review of laboratory studies, ordering and review of radiographic studies, pulse oximetry, re-evaluation of patient's condition, obtaining history from patient  or surrogate and review of old charts  ____________________________________________   INITIAL IMPRESSION / ASSESSMENT AND PLAN / ED COURSE  Raymond Erickson was evaluated in Emergency Department on 06/30/2020 for the symptoms described in the history of present illness. He was evaluated in the context of the global COVID-19 pandemic, which necessitated consideration that the patient might be at risk for infection with the SARS-CoV-2 virus that causes COVID-19. Institutional protocols and algorithms that pertain to the evaluation of patients at risk for COVID-19 are in a state of rapid change based on information released by regulatory bodies including the CDC and federal and state organizations. These policies and algorithms were followed during the patient's care in the ED.     Patient is a 65 year old who comes in for hyperglycemia.  Labs do not show evidence of DKA.  Patient was given some IV fluids.  Discussed with patient that he may need to be restarted on insulin but that is difficult to do for the emergency room.  We discussed possible admission to have this done inpatient and he declines.  We will hold off on starting metformin due to his history of cirrhosis and I discussed with pharmacy on the next best option.  We will start 2.5 of glipizide.  Discussed with patient that he should start with cutting the pill in half.  He is going to follow-up with his diabetes specialist or his primary care doctor for this.  Patient was also noted to have anemia.  I did a rectal exam there is no melena but there was stool that stained positive on Hemoccult.  Discussed with patient I would recommend admission for possible GI bleed.  Patient was adamant that he has had multiple endoscopies, colonoscopies, pill capsule endoscopies that have all been negative.  He states that he often needs blood every few months.  He understands the risk of bleeding due to his cirrhosis but states that he does not want to  be admitted to the hospital.  He denies any vomiting blood.  He states that he would rather go home.  He is open to getting 1 unit of blood but after that he does not want to stay in the hospital.  I again explained to patient that would help with his high sugars as well as his anemia but patient declined.  He understands the risk of death or permanent disability.  His wife is also at bedside who witnesses that patient will be leaving [place].  Patient has not been on his iron pills so we will restart these as well and this could be contributing to his low anemia.  Discussed with patient that he need to follow this up with his primary care doctor.    For patient's cough I will get a chest x-ray and COVID swab to further evaluate this   Patient handed off to oncoming team pending chest x-ray, blood transfusion and patient will be leaving against medical        ____________________________________________   FINAL CLINICAL IMPRESSION(S) / ED DIAGNOSES   Final diagnoses:  Hyperglycemia  Symptomatic anemia      MEDICATIONS GIVEN DURING THIS VISIT:  Medications  0.9 %  sodium chloride infusion (has no administration in time range)  0.9 %  sodium chloride infusion (0 mLs Intravenous Stopped 06/30/20 1511)     ED Discharge Orders         Ordered    ferrous sulfate 325 (65 FE) MG EC tablet  2 times daily        06/30/20  1514    glipiZIDE (GLUCOTROL) 5 MG tablet  Daily before breakfast        06/30/20 1514           Note:  This document was prepared using Dragon voice recognition software and may include unintentional dictation errors.   Vanessa Hermann, MD 06/30/20 [phone]

## 2020-07-01 LAB — TYPE AND SCREEN
ABO/RH(D): O POS
Antibody Screen: NEGATIVE
Unit division: 0

## 2020-07-01 LAB — BPAM RBC
Blood Product Expiration Date: 202202202359
ISSUE DATE / TIME: 202201231643
Unit Type and Rh: 5100

## 2020-07-01 LAB — SARS CORONAVIRUS 2 (TAT 6-24 HRS): SARS Coronavirus 2: POSITIVE — AB

## 2020-07-02 ENCOUNTER — Telehealth: Payer: Self-pay

## 2020-07-02 NOTE — Telephone Encounter (Signed)
Called to discuss with patient about COVID-19 symptoms and the use of one of the available treatments for those with mild to moderate Covid symptoms and at a high risk of hospitalization.  Pt appears to qualify for outpatient treatment due to co-morbid conditions and/or a member of an at-risk group in accordance with the FDA Emergency Use Authorization.    Symptom onset: Cough x 10 days per chart.Cough went away and is back. Vaccinated: Unknown Booster? Unknown Immunocompromised? No Qualifiers: COPD,HTN  Unable to reach pt - Left message and call back number [phone].   Esther Hardy

## 2021-08-07 DIAGNOSIS — D509 Iron deficiency anemia, unspecified: Secondary | ICD-10-CM | POA: Insufficient documentation

## 2021-08-07 NOTE — Progress Notes (Signed)
?[place]  ?[phone] [phone] ? ?ID: Raymond Erickson OB: 07/03/1955  MR#: [id number]  UUV#:[id number] ? ?Patient Care Team: ?Baxter Hire, MD as PCP - General (Internal Medicine) ? ?CHIEF COMPLAINT: Iron deficiency anemia. ? ?INTERVAL HISTORY: Patient is a 66 year old male with a history of GI bleed who has a history of iron deficiency anemia requiring blood transfusion.  He is referred today for further evaluation and consideration of IV iron.  He currently feels well and is asymptomatic.  He does not complain of any weakness or fatigue today.  He has no neurologic complaints.  He denies any recent fevers or illnesses.  He has a good appetite and denies weight loss.  He has no chest pain, shortness of breath, cough, or hemoptysis.  He denies any nausea, vomiting, constipation, or diarrhea.  He has no melena or hematochezia.  He has no urinary complaints.  Patient offers no specific complaints today. ? ?REVIEW OF SYSTEMS:   ?Review of Systems  ?Constitutional: Negative.  Negative for fever, malaise/fatigue and weight loss.  ?Respiratory: Negative.  Negative for cough, hemoptysis and shortness of breath.   ?Cardiovascular: Negative.  Negative for chest pain and leg swelling.  ?Gastrointestinal: Negative.  Negative for abdominal pain, blood in stool and melena.  ?Genitourinary: Negative.  Negative for dysuria and hematuria.  ?Musculoskeletal: Negative.   ?Skin: Negative.  Negative for rash.  ?Neurological: Negative.  Negative for dizziness, focal weakness, weakness and headaches.  ?Psychiatric/Behavioral: Negative.  The patient is not nervous/anxious.   ? ?As per HPI. Otherwise, a complete review of systems is negative. ? ?PAST MEDICAL HISTORY: ?Past Medical History:  ?Diagnosis Date  ? Alcohol abuse   ? Arthritis   ? Cirrhosis of liver ([place])   ? COPD with asthma ([place])   ? Essential tremor   ? Gout   ? Gout   ? Hematuria   ? HLD (hyperlipidemia)   ? HTN  (hypertension)   ? ? ?PAST SURGICAL HISTORY: ?Past Surgical History:  ?Procedure Laterality Date  ? broken nose    ? HERNIA REPAIR    ? ? ?FAMILY HISTORY: ?Family History  ?Problem Relation Age of Onset  ? Cirrhosis Brother   ? ? ?ADVANCED DIRECTIVES (Y/N):  N ? ?HEALTH MAINTENANCE: ?Social History  ? ?Tobacco Use  ? Smoking status: Every Day  ? Smokeless tobacco: Never  ?Vaping Use  ? Vaping Use: Never used  ?Substance Use Topics  ? Alcohol use: Yes  ?  Alcohol/week: 100.0 standard drinks  ?  Types: 100 Standard drinks or equivalent per week  ? Drug use: No  ? ? ? Colonoscopy: ? PAP: ? Bone density: ? Lipid panel: ? ?Allergies  ?Allergen Reactions  ? Ace Inhibitors Swelling  ? Lisinopril Swelling  ? Spironolactone Other (See Comments)  ?  gynecomastia  ? ? ?Current Outpatient Medications  ?Medication Sig Dispense Refill  ? allopurinol (ZYLOPRIM) 100 MG tablet Take 100 mg by mouth 2 (two) times daily.     ? amLODipine (NORVASC) 5 MG tablet Take 0.5 tablets (2.5 mg total) by mouth daily.    ? blood glucose meter kit and supplies KIT Dispense based on patient and insurance preference. Use up to four times daily as directed. (FOR ICD-9 250.00, 250.01). 1 each 0  ? clonazePAM (KLONOPIN) 0.5 MG tablet Take 0.5 mg by mouth 2 (two) times daily as needed for anxiety.     ? omeprazole (PRILOSEC) 20 MG capsule Take 20 mg by mouth 2 (  two) times daily.  1  ? propranolol (INDERAL) 40 MG tablet Take 40 mg by mouth 2 (two) times daily.     ? CONSTULOSE 10 GM/15ML solution Take 30 mLs by mouth 3 (three) times daily.    ? ferrous sulfate 325 (65 FE) MG EC tablet Take 1 tablet (325 mg total) by mouth 2 (two) times daily. 60 tablet 0  ? folic acid (FOLVITE) 1 MG tablet Take 1 mg by mouth daily.  0  ? gabapentin (NEURONTIN) 100 MG capsule Take 100 mg by mouth 3 (three) times daily.    ? glipiZIDE (GLUCOTROL) 5 MG tablet Take 0.5 tablets (2.5 mg total) by mouth daily before breakfast for 14 days. 7 tablet 0  ? insulin aspart (NOVOLOG)  100 UNIT/ML injection Inject 0-15 Units into the skin 3 (three) times daily with meals. CBG 70 - 120: 0 units  ?CBG 121 - 150: 2 units  ?CBG 151 - 200: 3 units  ?CBG 201 - 250: 5 units  ?CBG 251 - 300: 8 units  ?CBG 301 - 350: 11 units  ?CBG 351 - 400: 15 units  ?CBG > 400 call MD (Patient not taking: Reported on 12/12/2018) 10 mL 0  ? insulin aspart (NOVOLOG) 100 unit/mL injection Inject 8 Units into the skin 3 (three) times daily before meals. (Patient not taking: Reported on 12/12/2018) 1 vial 12  ? insulin aspart (NOVOLOG) 100 UNIT/ML injection Inject 0-5 Units into the skin at bedtime. CBG 70 - 120: 0 units  ?CBG 121 - 150: 0 units  ?CBG 151 - 200: 0 units  ?CBG 201 - 250: 2 units  ?CBG 251 - 300: 3 units  ?CBG 301 - 350: 4 units  ?CBG 351 - 400: 5 units  ?CBG > 400 call MD (Patient not taking: Reported on 12/12/2018) 10 mL 0  ? insulin glargine (LANTUS) 100 UNIT/ML injection Inject 0.35 mLs (35 Units total) into the skin daily. (Patient not taking: Reported on 12/12/2018) 10 mL 11  ? nicotine (NICODERM CQ - DOSED IN MG/24 HOURS) 14 mg/24hr patch Place 1 patch (14 mg total) onto the skin daily. (Patient not taking: Reported on 12/12/2018) 28 patch 0  ? ?No current facility-administered medications for this visit.  ? ? ?OBJECTIVE: ?Vitals:  ? 08/14/21 1121  ?BP: 129/72  ?Pulse: 60  ?Temp: 98.6 ?F (37 ?C)  ?   Body mass index is 35.28 kg/m?Marland Kitchen    ECOG FS:0 - Asymptomatic ? ?General: Well-developed, well-nourished, no acute distress. ?Eyes: Pink conjunctiva, anicteric sclera. ?HEENT: Normocephalic, moist mucous membranes. ?Lungs: No audible wheezing or coughing. ?Heart: Regular rate and rhythm. ?Abdomen: Soft, nontender, no obvious distention. ?Musculoskeletal: No edema, cyanosis, or clubbing. ?Neuro: Alert, answering all questions appropriately. Cranial nerves grossly intact. ?Skin: No rashes or petechiae noted. ?Psych: Normal affect. ?Lymphatics: No cervical, calvicular, axillary or inguinal LAD. ? ? ?LAB RESULTS: ? ?Lab  Results  ?Component Value Date  ? NA 132 (L) 06/30/2020  ? K 4.4 06/30/2020  ? CL 98 06/30/2020  ? CO2 23 06/30/2020  ? GLUCOSE 470 (H) 06/30/2020  ? BUN 13 06/30/2020  ? CREATININE 0.95 06/30/2020  ? CALCIUM 8.7 (L) 06/30/2020  ? PROT 7.6 06/30/2020  ? ALBUMIN 3.5 06/30/2020  ? AST 27 06/30/2020  ? ALT 18 06/30/2020  ? ALKPHOS 90 06/30/2020  ? BILITOT 0.8 06/30/2020  ? GFRNONAA >60 06/30/2020  ? GFRAA >60 12/13/2018  ? ? ?Lab Results  ?Component Value Date  ? WBC 6.6 06/30/2020  ?  NEUTROABS 6.0 12/28/2017  ? HGB 6.3 (L) 06/30/2020  ? HCT 26.7 (L) 06/30/2020  ? MCV 56.4 (L) 06/30/2020  ? PLT 81 (L) 06/30/2020  ? ? ? ?STUDIES: ?No results found. ? ?ASSESSMENT: Iron deficiency anemia. ? ?PLAN:   ? ?Iron deficiency anemia: Secondary to GI blood loss.  Patient reports colonoscopy, EGD, and video capsule endoscopy at outside facility did not reveal any distinct source.  His most recent hemoglobin on August 04, 2021 was reported 7.5.  He also was noted to have significantly reduced iron stores.  Patient will benefit from IV Venofer.  Return to clinic 5 times over the next several weeks to receive 200 mg of IV Venofer.  Patient will then return to clinic in 3 months with repeat laboratory work, further evaluation, and continuation of treatment if needed. ? ?I spent a total of 45 minutes reviewing chart data, face-to-face evaluation with the patient, counseling and coordination of care as detailed above. ? ? ?Patient expressed understanding and was in agreement with this plan. He also understands that He can call clinic at any time with any questions, concerns, or complaints.  ? ? ?Lloyd Huger, MD   08/14/2021 3:01 PM ? ? ? ? ?

## 2021-08-14 ENCOUNTER — Inpatient Hospital Stay: Payer: BC Managed Care – PPO

## 2021-08-14 ENCOUNTER — Inpatient Hospital Stay: Payer: BC Managed Care – PPO | Attending: Oncology | Admitting: Oncology

## 2021-08-14 ENCOUNTER — Other Ambulatory Visit: Payer: Self-pay

## 2021-08-14 ENCOUNTER — Encounter: Payer: Self-pay | Admitting: Oncology

## 2021-08-14 DIAGNOSIS — D509 Iron deficiency anemia, unspecified: Secondary | ICD-10-CM | POA: Diagnosis not present

## 2021-08-14 DIAGNOSIS — J449 Chronic obstructive pulmonary disease, unspecified: Secondary | ICD-10-CM | POA: Insufficient documentation

## 2021-08-14 DIAGNOSIS — I10 Essential (primary) hypertension: Secondary | ICD-10-CM | POA: Diagnosis not present

## 2021-08-14 DIAGNOSIS — E785 Hyperlipidemia, unspecified: Secondary | ICD-10-CM | POA: Insufficient documentation

## 2021-08-14 DIAGNOSIS — Z79899 Other long term (current) drug therapy: Secondary | ICD-10-CM | POA: Diagnosis not present

## 2021-08-14 DIAGNOSIS — K7460 Unspecified cirrhosis of liver: Secondary | ICD-10-CM | POA: Diagnosis not present

## 2021-08-14 DIAGNOSIS — F1010 Alcohol abuse, uncomplicated: Secondary | ICD-10-CM | POA: Diagnosis not present

## 2021-08-14 DIAGNOSIS — F17210 Nicotine dependence, cigarettes, uncomplicated: Secondary | ICD-10-CM | POA: Diagnosis not present

## 2021-08-14 DIAGNOSIS — D500 Iron deficiency anemia secondary to blood loss (chronic): Secondary | ICD-10-CM | POA: Diagnosis present

## 2021-08-14 DIAGNOSIS — K922 Gastrointestinal hemorrhage, unspecified: Secondary | ICD-10-CM | POA: Insufficient documentation

## 2021-08-18 ENCOUNTER — Other Ambulatory Visit: Payer: Self-pay

## 2021-08-18 ENCOUNTER — Inpatient Hospital Stay: Payer: BC Managed Care – PPO

## 2021-08-18 VITALS — BP 144/66 | HR 59 | Temp 97.0°F

## 2021-08-18 DIAGNOSIS — D509 Iron deficiency anemia, unspecified: Secondary | ICD-10-CM

## 2021-08-18 DIAGNOSIS — D500 Iron deficiency anemia secondary to blood loss (chronic): Secondary | ICD-10-CM | POA: Diagnosis not present

## 2021-08-18 MED ORDER — IRON SUCROSE 20 MG/ML IV SOLN
200.0000 mg | Freq: Once | INTRAVENOUS | Status: AC
  Administered 2021-08-18: 200 mg via INTRAVENOUS
  Filled 2021-08-18: qty 10

## 2021-08-18 MED ORDER — SODIUM CHLORIDE 0.9 % IV SOLN: 200.0000 mg | Freq: Once | INTRAVENOUS | Status: DC

## 2021-08-18 MED ORDER — SODIUM CHLORIDE 0.9 % IV SOLN
Freq: Once | INTRAVENOUS | Status: AC
  Filled 2021-08-18: qty 250

## 2021-08-18 NOTE — Patient Instructions (Signed)

## 2021-08-21 ENCOUNTER — Inpatient Hospital Stay: Payer: BC Managed Care – PPO

## 2021-08-21 ENCOUNTER — Other Ambulatory Visit: Payer: Self-pay

## 2021-08-21 VITALS — BP 155/72 | HR 62 | Temp 97.2°F

## 2021-08-21 DIAGNOSIS — D500 Iron deficiency anemia secondary to blood loss (chronic): Secondary | ICD-10-CM | POA: Diagnosis not present

## 2021-08-21 MED ORDER — SODIUM CHLORIDE 0.9 % IV SOLN: 200.0000 mg | Freq: Once | INTRAVENOUS | Status: DC

## 2021-08-21 MED ORDER — IRON SUCROSE 20 MG/ML IV SOLN
200.0000 mg | Freq: Once | INTRAVENOUS | Status: AC
  Administered 2021-08-21: 200 mg via INTRAVENOUS
  Filled 2021-08-21: qty 10

## 2021-08-21 MED ORDER — SODIUM CHLORIDE 0.9 % IV SOLN
Freq: Once | INTRAVENOUS | Status: AC
  Filled 2021-08-21: qty 250

## 2021-08-25 ENCOUNTER — Other Ambulatory Visit: Payer: Self-pay

## 2021-08-25 ENCOUNTER — Inpatient Hospital Stay: Payer: BC Managed Care – PPO

## 2021-08-25 VITALS — BP 163/81 | HR 60 | Temp 97.4°F | Resp 20

## 2021-08-25 DIAGNOSIS — D509 Iron deficiency anemia, unspecified: Secondary | ICD-10-CM

## 2021-08-25 DIAGNOSIS — D500 Iron deficiency anemia secondary to blood loss (chronic): Secondary | ICD-10-CM | POA: Diagnosis not present

## 2021-08-25 MED ORDER — SODIUM CHLORIDE 0.9 % IV SOLN: 200.0000 mg | Freq: Once | INTRAVENOUS | Status: DC

## 2021-08-25 MED ORDER — SODIUM CHLORIDE 0.9 % IV SOLN
Freq: Once | INTRAVENOUS | Status: AC
  Filled 2021-08-25: qty 250

## 2021-08-25 MED ORDER — IRON SUCROSE 20 MG/ML IV SOLN
200.0000 mg | Freq: Once | INTRAVENOUS | Status: AC
  Administered 2021-08-25: 200 mg via INTRAVENOUS
  Filled 2021-08-25: qty 10

## 2021-08-25 NOTE — Patient Instructions (Signed)

## 2021-08-28 ENCOUNTER — Inpatient Hospital Stay: Payer: BC Managed Care – PPO

## 2021-08-28 ENCOUNTER — Other Ambulatory Visit: Payer: Self-pay

## 2021-08-28 VITALS — BP 161/86 | HR 59 | Temp 96.3°F | Resp 18

## 2021-08-28 DIAGNOSIS — D509 Iron deficiency anemia, unspecified: Secondary | ICD-10-CM

## 2021-08-28 DIAGNOSIS — D500 Iron deficiency anemia secondary to blood loss (chronic): Secondary | ICD-10-CM | POA: Diagnosis not present

## 2021-08-28 MED ORDER — SODIUM CHLORIDE 0.9 % IV SOLN
Freq: Once | INTRAVENOUS | Status: AC
  Filled 2021-08-28: qty 250

## 2021-08-28 MED ORDER — SODIUM CHLORIDE 0.9 % IV SOLN: 200.0000 mg | Freq: Once | INTRAVENOUS | Status: DC

## 2021-08-28 MED ORDER — IRON SUCROSE 20 MG/ML IV SOLN
200.0000 mg | Freq: Once | INTRAVENOUS | Status: AC
  Administered 2021-08-28: 200 mg via INTRAVENOUS
  Filled 2021-08-28: qty 10

## 2021-08-28 NOTE — Patient Instructions (Signed)
[facility]  Discharge Instructions: ?Thank you for choosing [facility] to provide your oncology and hematology care.  ?If you have a lab appointment with the Cancer Center, please go directly to the Cancer Center and check in at the registration area. ? ?Wear comfortable clothing and clothing appropriate for easy access to any Portacath or PICC line.  ? ?We strive to give you quality time with your provider. You may need to reschedule your appointment if you arrive late (15 or more minutes).  Arriving late affects you and other patients whose appointments are after yours.  Also, if you miss three or more appointments without notifying the office, you may be dismissed from the clinic at the provider?s discretion.    ?  ?For prescription refill requests, have your pharmacy contact our office and allow 72 hours for refills to be completed.   ? ?Today you received the following chemotherapy and/or immunotherapy agents VENOFER    ?  ?To help prevent nausea and vomiting after your treatment, we encourage you to take your nausea medication as directed. ? ?BELOW ARE SYMPTOMS THAT SHOULD BE REPORTED IMMEDIATELY: ?*FEVER GREATER THAN 100.4 F (38 ?C) OR HIGHER ?*CHILLS OR SWEATING ?*NAUSEA AND VOMITING THAT IS NOT CONTROLLED WITH YOUR NAUSEA MEDICATION ?*UNUSUAL SHORTNESS OF BREATH ?*UNUSUAL BRUISING OR BLEEDING ?*URINARY PROBLEMS (pain or burning when urinating, or frequent urination) ?*BOWEL PROBLEMS (unusual diarrhea, constipation, pain near the anus) ?TENDERNESS IN MOUTH AND THROAT WITH OR WITHOUT PRESENCE OF ULCERS (sore throat, sores in mouth, or a toothache) ?UNUSUAL RASH, SWELLING OR PAIN  ?UNUSUAL VAGINAL DISCHARGE OR ITCHING  ? ?Items with * indicate a potential emergency and should be followed up as soon as possible or go to the Emergency Department if any problems should occur. ? ?Please show the CHEMOTHERAPY ALERT CARD or IMMUNOTHERAPY ALERT CARD at check-in to the  Emergency Department and triage nurse. ? ?Should you have questions after your visit or need to cancel or reschedule your appointment, please contact [facility]  [phone] and follow the prompts.  Office hours are 8:00 a.m. to 4:30 p.m. Monday - Friday. Please note that voicemails left after 4:00 p.m. may not be returned until the following business day.  We are closed weekends and major holidays. You have access to a nurse at all times for urgent questions. Please call the main number to the clinic [phone] and follow the prompts. ? ?For any non-urgent questions, you may also contact your provider using MyChart. We now offer e-Visits for anyone 18 and older to request care online for non-urgent symptoms. For details visit [email or web address]. ?  ?Also download the MyChart app! Go to the app store, search "MyChart", open the app, select [facility] [employer] [facility] [employer], and log in with your MyChart username and password. ? ?Due to Covid, a mask is required upon entering the hospital/clinic. If you do not have a mask, one will be given to you upon arrival. For doctor visits, patients may have 1 support person aged 18 or older with them. For treatment visits, patients cannot have anyone with them due to current Covid guidelines and our immunocompromised population.  ? ?Iron Sucrose Injection ?What is this medication? ?IRON SUCROSE (EYE ern SOO krose) treats low levels of iron (iron deficiency anemia) in people with kidney disease. Iron is a mineral that plays an important role in making red blood cells, which carry oxygen from your lungs to the rest of your body. ?This medicine may   be used for other purposes; ask your health care provider or pharmacist if you have questions. ?COMMON BRAND NAME(S): Venofer ?What should I tell my care team before I take this medication? ?They need to know if you have any of these conditions: ?Anemia not caused by low iron levels ?Heart disease ?High levels of  iron in the blood ?Kidney disease ?Liver disease ?An unusual or allergic reaction to iron, other medications, foods, dyes, or preservatives ?Pregnant or trying to get pregnant ?Breast-feeding ?How should I use this medication? ?This medication is for infusion into a vein. It is given in a hospital or clinic setting. ?Talk to your care team about the use of this medication in children. While this medication may be prescribed for children as young as 2 years for selected conditions, precautions do apply. ?Overdosage: If you think you have taken too much of this medicine contact a poison control center or emergency room at once. ?NOTE: This medicine is only for you. Do not share this medicine with others. ?What if I miss a dose? ?It is important not to miss your dose. Call your care team if you are unable to keep an appointment. ?What may interact with this medication? ?Do not take this medication with any of the following: ?Deferoxamine ?Dimercaprol ?Other iron products ?This medication may also interact with the following: ?Chloramphenicol ?Deferasirox ?This list may not describe all possible interactions. Give your health care provider a list of all the medicines, herbs, non-prescription drugs, or dietary supplements you use. Also tell them if you smoke, drink alcohol, or use illegal drugs. Some items may interact with your medicine. ?What should I watch for while using this medication? ?Visit your care team regularly. Tell your care team if your symptoms do not start to get better or if they get worse. You may need blood work done while you are taking this medication. ?You may need to follow a special diet. Talk to your care team. Foods that contain iron include: whole grains/cereals, dried fruits, beans, or peas, leafy green vegetables, and organ meats (liver, kidney). ?What side effects may I notice from receiving this medication? ?Side effects that you should report to your care team as soon as  possible: ?Allergic reactions--skin rash, itching, hives, swelling of the face, lips, tongue, or throat ?Low blood pressure--dizziness, feeling faint or lightheaded, blurry vision ?Shortness of breath ?Side effects that usually do not require medical attention (report to your care team if they continue or are bothersome): ?Flushing ?Headache ?Joint pain ?Muscle pain ?Nausea ?Pain, redness, or irritation at injection site ?This list may not describe all possible side effects. Call your doctor for medical advice about side effects. You may report side effects to FDA at 1-800-FDA-1088. ?Where should I keep my medication? ?This medication is given in a hospital or clinic and will not be stored at home. ?NOTE: This sheet is a summary. It may not cover all possible information. If you have questions about this medicine, talk to your doctor, pharmacist, or health care provider. ?? 2022 Elsevier/Gold Standard (2020-10-18 00:00:00) ? ?

## 2021-09-01 ENCOUNTER — Other Ambulatory Visit: Payer: Self-pay

## 2021-09-01 ENCOUNTER — Inpatient Hospital Stay: Payer: BC Managed Care – PPO

## 2021-09-01 VITALS — BP 155/70 | HR 53 | Temp 95.7°F | Resp 20

## 2021-09-01 DIAGNOSIS — D509 Iron deficiency anemia, unspecified: Secondary | ICD-10-CM

## 2021-09-01 DIAGNOSIS — D500 Iron deficiency anemia secondary to blood loss (chronic): Secondary | ICD-10-CM | POA: Diagnosis not present

## 2021-09-01 MED ORDER — SODIUM CHLORIDE 0.9 % IV SOLN: 200.0000 mg | Freq: Once | INTRAVENOUS | Status: DC

## 2021-09-01 MED ORDER — SODIUM CHLORIDE 0.9 % IV SOLN
Freq: Once | INTRAVENOUS | Status: AC
  Filled 2021-09-01: qty 250

## 2021-09-01 MED ORDER — IRON SUCROSE 20 MG/ML IV SOLN
200.0000 mg | Freq: Once | INTRAVENOUS | Status: AC
  Administered 2021-09-01: 200 mg via INTRAVENOUS
  Filled 2021-09-01: qty 10

## 2021-11-17 ENCOUNTER — Inpatient Hospital Stay: Payer: BC Managed Care – PPO | Attending: Oncology

## 2021-11-18 ENCOUNTER — Inpatient Hospital Stay: Payer: BC Managed Care – PPO

## 2021-11-18 ENCOUNTER — Inpatient Hospital Stay: Payer: BC Managed Care – PPO | Admitting: Oncology

## 2021-11-18 NOTE — Progress Notes (Deleted)
[place]  Telephone:([phone] Fax:(336) [phone]  ID: Florene Route III OB: [birth year]  MR#: [id number]  JEH#:[id number]  Patient Care Team: Baxter Hire, MD as PCP - General (Internal Medicine)  CHIEF COMPLAINT: Iron deficiency anemia.  INTERVAL HISTORY: Patient is a 66 year old male with a history of GI bleed who has a history of iron deficiency anemia requiring blood transfusion.  He is referred today for further evaluation and consideration of IV iron.  He currently feels well and is asymptomatic.  He does not complain of any weakness or fatigue today.  He has no neurologic complaints.  He denies any recent fevers or illnesses.  He has a good appetite and denies weight loss.  He has no chest pain, shortness of breath, cough, or hemoptysis.  He denies any nausea, vomiting, constipation, or diarrhea.  He has no melena or hematochezia.  He has no urinary complaints.  Patient offers no specific complaints today.  REVIEW OF SYSTEMS:   Review of Systems  Constitutional: Negative.  Negative for fever, malaise/fatigue and weight loss.  Respiratory: Negative.  Negative for cough, hemoptysis and shortness of breath.   Cardiovascular: Negative.  Negative for chest pain and leg swelling.  Gastrointestinal: Negative.  Negative for abdominal pain, blood in stool and melena.  Genitourinary: Negative.  Negative for dysuria and hematuria.  Musculoskeletal: Negative.   Skin: Negative.  Negative for rash.  Neurological: Negative.  Negative for dizziness, focal weakness, weakness and headaches.  Psychiatric/Behavioral: Negative.  The patient is not nervous/anxious.     As per HPI. Otherwise, a complete review of systems is negative.  PAST MEDICAL HISTORY: Past Medical History:  Diagnosis Date   Alcohol abuse    Arthritis    Cirrhosis of liver ([place])    COPD with asthma ([place])    Essential tremor    Gout    Gout    Hematuria    HLD (hyperlipidemia)    HTN  (hypertension)     PAST SURGICAL HISTORY: Past Surgical History:  Procedure Laterality Date   broken nose     HERNIA REPAIR      FAMILY HISTORY: Family History  Problem Relation Age of Onset   Cirrhosis Brother     ADVANCED DIRECTIVES (Y/N):  N  HEALTH MAINTENANCE: Social History   Tobacco Use   Smoking status: Every Day   Smokeless tobacco: Never  Vaping Use   Vaping Use: Never used  Substance Use Topics   Alcohol use: Yes    Alcohol/week: 100.0 standard drinks of alcohol    Types: 100 Standard drinks or equivalent per week   Drug use: No     Colonoscopy:  PAP:  Bone density:  Lipid panel:  Allergies  Allergen Reactions   Ace Inhibitors Swelling   Lisinopril Swelling   Spironolactone Other (See Comments)    gynecomastia    Current Outpatient Medications  Medication Sig Dispense Refill   allopurinol (ZYLOPRIM) 100 MG tablet Take 100 mg by mouth 2 (two) times daily.      amLODipine (NORVASC) 5 MG tablet Take 0.5 tablets (2.5 mg total) by mouth daily.     blood glucose meter kit and supplies KIT Dispense based on patient and insurance preference. Use up to four times daily as directed. (FOR ICD-9 250.00, 250.01). 1 each 0   clonazePAM (KLONOPIN) 0.5 MG tablet Take 0.5 mg by mouth 2 (two) times daily as needed for anxiety.      CONSTULOSE 10 GM/15ML solution Take 30 mLs by  mouth 3 (three) times daily.     ferrous sulfate 325 (65 FE) MG EC tablet Take 1 tablet (325 mg total) by mouth 2 (two) times daily. 60 tablet 0   folic acid (FOLVITE) 1 MG tablet Take 1 mg by mouth daily.  0   gabapentin (NEURONTIN) 100 MG capsule Take 100 mg by mouth 3 (three) times daily.     glipiZIDE (GLUCOTROL) 5 MG tablet Take 0.5 tablets (2.5 mg total) by mouth daily before breakfast for 14 days. 7 tablet 0   insulin aspart (NOVOLOG) 100 UNIT/ML injection Inject 0-15 Units into the skin 3 (three) times daily with meals. CBG 70 - 120: 0 units  CBG 121 - 150: 2 units  CBG 151 - 200: 3  units  CBG 201 - 250: 5 units  CBG 251 - 300: 8 units  CBG 301 - 350: 11 units  CBG 351 - 400: 15 units  CBG > 400 call MD (Patient not taking: Reported on 12/12/2018) 10 mL 0   insulin aspart (NOVOLOG) 100 unit/mL injection Inject 8 Units into the skin 3 (three) times daily before meals. (Patient not taking: Reported on 12/12/2018) 1 vial 12   insulin aspart (NOVOLOG) 100 UNIT/ML injection Inject 0-5 Units into the skin at bedtime. CBG 70 - 120: 0 units  CBG 121 - 150: 0 units  CBG 151 - 200: 0 units  CBG 201 - 250: 2 units  CBG 251 - 300: 3 units  CBG 301 - 350: 4 units  CBG 351 - 400: 5 units  CBG > 400 call MD (Patient not taking: Reported on 12/12/2018) 10 mL 0   insulin glargine (LANTUS) 100 UNIT/ML injection Inject 0.35 mLs (35 Units total) into the skin daily. (Patient not taking: Reported on 12/12/2018) 10 mL 11   nicotine (NICODERM CQ - DOSED IN MG/24 HOURS) 14 mg/24hr patch Place 1 patch (14 mg total) onto the skin daily. (Patient not taking: Reported on 12/12/2018) 28 patch 0   omeprazole (PRILOSEC) 20 MG capsule Take 20 mg by mouth 2 (two) times daily.  1   propranolol (INDERAL) 40 MG tablet Take 40 mg by mouth 2 (two) times daily.      No current facility-administered medications for this visit.    OBJECTIVE: There were no vitals filed for this visit.    There is no height or weight on file to calculate BMI.    ECOG FS:0 - Asymptomatic  General: Well-developed, well-nourished, no acute distress. Eyes: Pink conjunctiva, anicteric sclera. HEENT: Normocephalic, moist mucous membranes. Lungs: No audible wheezing or coughing. Heart: Regular rate and rhythm. Abdomen: Soft, nontender, no obvious distention. Musculoskeletal: No edema, cyanosis, or clubbing. Neuro: Alert, answering all questions appropriately. Cranial nerves grossly intact. Skin: No rashes or petechiae noted. Psych: Normal affect. Lymphatics: No cervical, calvicular, axillary or inguinal LAD.   LAB RESULTS:  Lab  Results  Component Value Date   NA 132 (L) 06/30/2020   K 4.4 06/30/2020   CL 98 06/30/2020   CO2 23 06/30/2020   GLUCOSE 470 (H) 06/30/2020   BUN 13 06/30/2020   CREATININE 0.95 06/30/2020   CALCIUM 8.7 (L) 06/30/2020   PROT 7.6 06/30/2020   ALBUMIN 3.5 06/30/2020   AST 27 06/30/2020   ALT 18 06/30/2020   ALKPHOS 90 06/30/2020   BILITOT 0.8 06/30/2020   GFRNONAA >60 06/30/2020   GFRAA >60 12/13/2018    Lab Results  Component Value Date   WBC 6.6 06/30/2020   NEUTROABS 6.0  12/28/2017   HGB 6.3 (L) 06/30/2020   HCT 26.7 (L) 06/30/2020   MCV 56.4 (L) 06/30/2020   PLT 81 (L) 06/30/2020     STUDIES: No results found.  ASSESSMENT: Iron deficiency anemia.  PLAN:    Iron deficiency anemia: Secondary to GI blood loss.  Patient reports colonoscopy, EGD, and video capsule endoscopy at outside facility did not reveal any distinct source.  His most recent hemoglobin on August 04, 2021 was reported 7.5.  He also was noted to have significantly reduced iron stores.  Patient will benefit from IV Venofer.  Return to clinic 5 times over the next several weeks to receive 200 mg of IV Venofer.  Patient will then return to clinic in 3 months with repeat laboratory work, further evaluation, and continuation of treatment if needed.  I spent a total of 45 minutes reviewing chart data, face-to-face evaluation with the patient, counseling and coordination of care as detailed above.   Patient expressed understanding and was in agreement with this plan. He also understands that He can call clinic at any time with any questions, concerns, or complaints.    Lloyd Huger, MD   11/18/2021 6:38 AM

## 2021-11-20 ENCOUNTER — Other Ambulatory Visit: Payer: Self-pay | Admitting: *Deleted

## 2021-11-20 DIAGNOSIS — D509 Iron deficiency anemia, unspecified: Secondary | ICD-10-CM

## 2021-11-21 NOTE — Progress Notes (Deleted)
[place]  Telephone:(336) [phone] Fax:(336) [phone]  ID: Raymond Erickson OB: 04-15-1956  MR#: [id number]  JQB#:[id number]  Patient Care Team: Baxter Hire, MD as PCP - General (Internal Medicine)  CHIEF COMPLAINT: Iron deficiency anemia.  INTERVAL HISTORY: Patient is a 66 year old male with a history of GI bleed who has a history of iron deficiency anemia requiring blood transfusion.  He is referred today for further evaluation and consideration of IV iron.  He currently feels well and is asymptomatic.  He does not complain of any weakness or fatigue today.  He has no neurologic complaints.  He denies any recent fevers or illnesses.  He has a good appetite and denies weight loss.  He has no chest pain, shortness of breath, cough, or hemoptysis.  He denies any nausea, vomiting, constipation, or diarrhea.  He has no melena or hematochezia.  He has no urinary complaints.  Patient offers no specific complaints today.  REVIEW OF SYSTEMS:   Review of Systems  Constitutional: Negative.  Negative for fever, malaise/fatigue and weight loss.  Respiratory: Negative.  Negative for cough, hemoptysis and shortness of breath.   Cardiovascular: Negative.  Negative for chest pain and leg swelling.  Gastrointestinal: Negative.  Negative for abdominal pain, blood in stool and melena.  Genitourinary: Negative.  Negative for dysuria and hematuria.  Musculoskeletal: Negative.   Skin: Negative.  Negative for rash.  Neurological: Negative.  Negative for dizziness, focal weakness, weakness and headaches.  Psychiatric/Behavioral: Negative.  The patient is not nervous/anxious.     As per HPI. Otherwise, a complete review of systems is negative.  PAST MEDICAL HISTORY: Past Medical History:  Diagnosis Date   Alcohol abuse    Arthritis    Cirrhosis of liver ([place])    COPD with asthma ([place])    Essential tremor    Gout    Gout    Hematuria    HLD (hyperlipidemia)    HTN  (hypertension)     PAST SURGICAL HISTORY: Past Surgical History:  Procedure Laterality Date   broken nose     HERNIA REPAIR      FAMILY HISTORY: Family History  Problem Relation Age of Onset   Cirrhosis Brother     ADVANCED DIRECTIVES (Y/N):  N  HEALTH MAINTENANCE: Social History   Tobacco Use   Smoking status: Every Day   Smokeless tobacco: Never  Vaping Use   Vaping Use: Never used  Substance Use Topics   Alcohol use: Yes    Alcohol/week: 100.0 standard drinks of alcohol    Types: 100 Standard drinks or equivalent per week   Drug use: No     Colonoscopy:  PAP:  Bone density:  Lipid panel:  Allergies  Allergen Reactions   Ace Inhibitors Swelling   Lisinopril Swelling   Spironolactone Other (See Comments)    gynecomastia    Current Outpatient Medications  Medication Sig Dispense Refill   allopurinol (ZYLOPRIM) 100 MG tablet Take 100 mg by mouth 2 (two) times daily.      amLODipine (NORVASC) 5 MG tablet Take 0.5 tablets (2.5 mg total) by mouth daily.     blood glucose meter kit and supplies KIT Dispense based on patient and insurance preference. Use up to four times daily as directed. (FOR ICD-9 250.00, 250.01). 1 each 0   clonazePAM (KLONOPIN) 0.5 MG tablet Take 0.5 mg by mouth 2 (two) times daily as needed for anxiety.      CONSTULOSE 10 GM/15ML solution Take 30 mLs by  mouth 3 (three) times daily.     ferrous sulfate 325 (65 FE) MG EC tablet Take 1 tablet (325 mg total) by mouth 2 (two) times daily. 60 tablet 0   folic acid (FOLVITE) 1 MG tablet Take 1 mg by mouth daily.  0   gabapentin (NEURONTIN) 100 MG capsule Take 100 mg by mouth 3 (three) times daily.     glipiZIDE (GLUCOTROL) 5 MG tablet Take 0.5 tablets (2.5 mg total) by mouth daily before breakfast for 14 days. 7 tablet 0   insulin aspart (NOVOLOG) 100 UNIT/ML injection Inject 0-15 Units into the skin 3 (three) times daily with meals. CBG 70 - 120: 0 units  CBG 121 - 150: 2 units  CBG 151 - 200: 3  units  CBG 201 - 250: 5 units  CBG 251 - 300: 8 units  CBG 301 - 350: 11 units  CBG 351 - 400: 15 units  CBG > 400 call MD (Patient not taking: Reported on 12/12/2018) 10 mL 0   insulin aspart (NOVOLOG) 100 unit/mL injection Inject 8 Units into the skin 3 (three) times daily before meals. (Patient not taking: Reported on 12/12/2018) 1 vial 12   insulin aspart (NOVOLOG) 100 UNIT/ML injection Inject 0-5 Units into the skin at bedtime. CBG 70 - 120: 0 units  CBG 121 - 150: 0 units  CBG 151 - 200: 0 units  CBG 201 - 250: 2 units  CBG 251 - 300: 3 units  CBG 301 - 350: 4 units  CBG 351 - 400: 5 units  CBG > 400 call MD (Patient not taking: Reported on 12/12/2018) 10 mL 0   insulin glargine (LANTUS) 100 UNIT/ML injection Inject 0.35 mLs (35 Units total) into the skin daily. (Patient not taking: Reported on 12/12/2018) 10 mL 11   nicotine (NICODERM CQ - DOSED IN MG/24 HOURS) 14 mg/24hr patch Place 1 patch (14 mg total) onto the skin daily. (Patient not taking: Reported on 12/12/2018) 28 patch 0   omeprazole (PRILOSEC) 20 MG capsule Take 20 mg by mouth 2 (two) times daily.  1   propranolol (INDERAL) 40 MG tablet Take 40 mg by mouth 2 (two) times daily.      No current facility-administered medications for this visit.    OBJECTIVE: There were no vitals filed for this visit.    There is no height or weight on file to calculate BMI.    ECOG FS:0 - Asymptomatic  General: Well-developed, well-nourished, no acute distress. Eyes: Pink conjunctiva, anicteric sclera. HEENT: Normocephalic, moist mucous membranes. Lungs: No audible wheezing or coughing. Heart: Regular rate and rhythm. Abdomen: Soft, nontender, no obvious distention. Musculoskeletal: No edema, cyanosis, or clubbing. Neuro: Alert, answering all questions appropriately. Cranial nerves grossly intact. Skin: No rashes or petechiae noted. Psych: Normal affect. Lymphatics: No cervical, calvicular, axillary or inguinal LAD.   LAB RESULTS:  Lab  Results  Component Value Date   NA 132 (L) 06/30/2020   K 4.4 06/30/2020   CL 98 06/30/2020   CO2 23 06/30/2020   GLUCOSE 470 (H) 06/30/2020   BUN 13 06/30/2020   CREATININE 0.95 06/30/2020   CALCIUM 8.7 (L) 06/30/2020   PROT 7.6 06/30/2020   ALBUMIN 3.5 06/30/2020   AST 27 06/30/2020   ALT 18 06/30/2020   ALKPHOS 90 06/30/2020   BILITOT 0.8 06/30/2020   GFRNONAA >60 06/30/2020   GFRAA >60 12/13/2018    Lab Results  Component Value Date   WBC 6.6 06/30/2020   NEUTROABS 6.0  12/28/2017   HGB 6.3 (L) 06/30/2020   HCT 26.7 (L) 06/30/2020   MCV 56.4 (L) 06/30/2020   PLT 81 (L) 06/30/2020     STUDIES: No results found.  ASSESSMENT: Iron deficiency anemia.  PLAN:    Iron deficiency anemia: Secondary to GI blood loss.  Patient reports colonoscopy, EGD, and video capsule endoscopy at outside facility did not reveal any distinct source.  His most recent hemoglobin on August 04, 2021 was reported 7.5.  He also was noted to have significantly reduced iron stores.  Patient will benefit from IV Venofer.  Return to clinic 5 times over the next several weeks to receive 200 mg of IV Venofer.  Patient will then return to clinic in 3 months with repeat laboratory work, further evaluation, and continuation of treatment if needed.  I spent a total of 45 minutes reviewing chart data, face-to-face evaluation with the patient, counseling and coordination of care as detailed above.   Patient expressed understanding and was in agreement with this plan. He also understands that He can call clinic at any time with any questions, concerns, or complaints.    Lloyd Huger, MD   11/21/2021 12:23 PM

## 2021-11-24 ENCOUNTER — Inpatient Hospital Stay: Payer: BC Managed Care – PPO

## 2021-11-25 ENCOUNTER — Encounter: Payer: Self-pay | Admitting: Oncology

## 2021-11-25 ENCOUNTER — Inpatient Hospital Stay: Payer: BC Managed Care – PPO | Admitting: Oncology

## 2021-11-25 ENCOUNTER — Inpatient Hospital Stay: Payer: BC Managed Care – PPO

## 2021-11-25 DIAGNOSIS — D509 Iron deficiency anemia, unspecified: Secondary | ICD-10-CM

## 2022-03-09 ENCOUNTER — Emergency Department: Payer: BC Managed Care – PPO

## 2022-03-09 ENCOUNTER — Inpatient Hospital Stay
Admission: EM | Admit: 2022-03-09 | Discharge: 2022-03-09 | DRG: 811 | Discharge status: Against Medical Advice | Payer: BC Managed Care – PPO | Attending: Internal Medicine | Admitting: Internal Medicine

## 2022-03-09 ENCOUNTER — Other Ambulatory Visit: Payer: Self-pay

## 2022-03-09 ENCOUNTER — Encounter: Payer: Self-pay | Admitting: Emergency Medicine

## 2022-03-09 ENCOUNTER — Inpatient Hospital Stay: Payer: BC Managed Care – PPO

## 2022-03-09 DIAGNOSIS — Z20822 Contact with and (suspected) exposure to covid-19: Secondary | ICD-10-CM | POA: Diagnosis present

## 2022-03-09 DIAGNOSIS — K7030 Alcoholic cirrhosis of liver without ascites: Secondary | ICD-10-CM | POA: Diagnosis present

## 2022-03-09 DIAGNOSIS — M109 Gout, unspecified: Secondary | ICD-10-CM | POA: Diagnosis present

## 2022-03-09 DIAGNOSIS — F1010 Alcohol abuse, uncomplicated: Secondary | ICD-10-CM | POA: Diagnosis present

## 2022-03-09 DIAGNOSIS — I10 Essential (primary) hypertension: Secondary | ICD-10-CM | POA: Diagnosis present

## 2022-03-09 DIAGNOSIS — E785 Hyperlipidemia, unspecified: Secondary | ICD-10-CM | POA: Diagnosis present

## 2022-03-09 DIAGNOSIS — K766 Portal hypertension: Secondary | ICD-10-CM | POA: Diagnosis present

## 2022-03-09 DIAGNOSIS — D649 Anemia, unspecified: Secondary | ICD-10-CM

## 2022-03-09 DIAGNOSIS — J189 Pneumonia, unspecified organism: Secondary | ICD-10-CM | POA: Diagnosis present

## 2022-03-09 DIAGNOSIS — Z79899 Other long term (current) drug therapy: Secondary | ICD-10-CM | POA: Diagnosis not present

## 2022-03-09 DIAGNOSIS — J449 Chronic obstructive pulmonary disease, unspecified: Secondary | ICD-10-CM | POA: Diagnosis present

## 2022-03-09 DIAGNOSIS — E119 Type 2 diabetes mellitus without complications: Secondary | ICD-10-CM

## 2022-03-09 DIAGNOSIS — F17200 Nicotine dependence, unspecified, uncomplicated: Secondary | ICD-10-CM | POA: Diagnosis present

## 2022-03-09 DIAGNOSIS — Z888 Allergy status to other drugs, medicaments and biological substances status: Secondary | ICD-10-CM | POA: Diagnosis not present

## 2022-03-09 DIAGNOSIS — J440 Chronic obstructive pulmonary disease with acute lower respiratory infection: Secondary | ICD-10-CM | POA: Diagnosis present

## 2022-03-09 DIAGNOSIS — R0602 Shortness of breath: Principal | ICD-10-CM

## 2022-03-09 DIAGNOSIS — D500 Iron deficiency anemia secondary to blood loss (chronic): Principal | ICD-10-CM | POA: Diagnosis present

## 2022-03-09 DIAGNOSIS — D696 Thrombocytopenia, unspecified: Secondary | ICD-10-CM | POA: Diagnosis present

## 2022-03-09 LAB — TROPONIN I (HIGH SENSITIVITY)
Troponin I (High Sensitivity): 16 ng/L (ref <18)
Troponin I (High Sensitivity): 19 ng/L — ABNORMAL HIGH (ref <18)

## 2022-03-09 LAB — COMPREHENSIVE METABOLIC PANEL
ALT: 15 U/L (ref 0–44)
AST: 31 U/L (ref 15–41)
Albumin: 3.4 g/dL — ABNORMAL LOW (ref 3.5–5.0)
Alkaline Phosphatase: 64 U/L (ref 38–126)
Anion gap: 8 (ref 5–15)
BUN: 11 mg/dL (ref 8–23)
CO2: 20 mmol/L — ABNORMAL LOW (ref 22–32)
Calcium: 8.5 mg/dL — ABNORMAL LOW (ref 8.9–10.3)
Chloride: 109 mmol/L (ref 98–111)
Creatinine, Ser: 1.04 mg/dL (ref 0.61–1.24)
GFR, Estimated: >60 mL/min (ref >60)
Glucose, Bld: 128 mg/dL — ABNORMAL HIGH (ref 70–99)
Potassium: 3.6 mmol/L (ref 3.5–5.1)
Sodium: 137 mmol/L (ref 135–145)
Total Bilirubin: 0.8 mg/dL (ref 0.3–1.2)
Total Protein: 7.1 g/dL (ref 6.5–8.1)

## 2022-03-09 LAB — CBC
HCT: 19.9 % — ABNORMAL LOW (ref 39.0–52.0)
Hemoglobin: 4.1 g/dL — CL (ref 13.0–17.0)
MCH: 11.8 pg — ABNORMAL LOW (ref 26.0–34.0)
MCHC: 20.6 g/dL — ABNORMAL LOW (ref 30.0–36.0)
MCV: 57.5 fL — ABNORMAL LOW (ref 80.0–100.0)
Platelets: 91 10*3/uL — ABNORMAL LOW (ref 150–400)
RBC: 3.46 MIL/uL — ABNORMAL LOW (ref 4.22–5.81)
RDW: 25.3 % — ABNORMAL HIGH (ref 11.5–15.5)
WBC: 5.6 10*3/uL (ref 4.0–10.5)
nRBC: 0.5 % — ABNORMAL HIGH (ref 0.0–0.2)

## 2022-03-09 LAB — IRON AND TIBC
Iron: 11 ug/dL — ABNORMAL LOW (ref 45–182)
Saturation Ratios: 2 % — ABNORMAL LOW (ref 17.9–39.5)
TIBC: 477 ug/dL — ABNORMAL HIGH (ref 250–450)
UIBC: 466 ug/dL

## 2022-03-09 LAB — RESP PANEL BY RT-PCR (FLU A&B, COVID) ARPGX2
Influenza A by PCR: NEGATIVE
Influenza B by PCR: NEGATIVE
SARS Coronavirus 2 by RT PCR: NEGATIVE

## 2022-03-09 LAB — PREPARE RBC (CROSSMATCH)

## 2022-03-09 LAB — CBG MONITORING, ED: Glucose-Capillary: 230 mg/dL — ABNORMAL HIGH (ref 70–99)

## 2022-03-09 LAB — HEMOGLOBIN AND HEMATOCRIT, BLOOD
HCT: 18.5 % — ABNORMAL LOW (ref 39.0–52.0)
Hemoglobin: 4.1 g/dL — CL (ref 13.0–17.0)

## 2022-03-09 LAB — TYPE AND SCREEN

## 2022-03-09 MED ORDER — IPRATROPIUM-ALBUTEROL 0.5-2.5 (3) MG/3ML IN SOLN: 3.0000 mL | Freq: Four times a day (QID) | RESPIRATORY_TRACT | Status: DC | PRN

## 2022-03-09 MED ORDER — PROPRANOLOL HCL 20 MG PO TABS: 40.0000 mg | ORAL_TABLET | Freq: Two times a day (BID) | ORAL | Status: DC

## 2022-03-09 MED ORDER — AMLODIPINE BESYLATE 5 MG PO TABS
2.5000 mg | ORAL_TABLET | Freq: Every day | ORAL | Status: DC
  Administered 2022-03-09: 2.5 mg via ORAL
  Filled 2022-03-09: qty 1

## 2022-03-09 MED ORDER — PANTOPRAZOLE SODIUM 40 MG PO TBEC
40.0000 mg | DELAYED_RELEASE_TABLET | Freq: Every day | ORAL | Status: DC
  Administered 2022-03-09: 40 mg via ORAL
  Filled 2022-03-09: qty 1

## 2022-03-09 MED ORDER — ONDANSETRON HCL 4 MG/2ML IJ SOLN: 4.0000 mg | Freq: Four times a day (QID) | INTRAMUSCULAR | Status: DC | PRN

## 2022-03-09 MED ORDER — ONDANSETRON HCL 4 MG PO TABS: 4.0000 mg | ORAL_TABLET | Freq: Four times a day (QID) | ORAL | Status: DC | PRN

## 2022-03-09 MED ORDER — INSULIN ASPART 100 UNIT/ML IJ SOLN
0.0000 [IU] | Freq: Three times a day (TID) | INTRAMUSCULAR | Status: DC
  Administered 2022-03-09: 5 [IU] via SUBCUTANEOUS
  Filled 2022-03-09: qty 1

## 2022-03-09 MED ORDER — SODIUM CHLORIDE 0.9 % IV SOLN: 500.0000 mg | Freq: Every day | INTRAVENOUS | Status: DC

## 2022-03-09 MED ORDER — SODIUM CHLORIDE 0.9 % IV SOLN
500.0000 mg | Freq: Once | INTRAVENOUS | Status: AC
  Administered 2022-03-09: 500 mg via INTRAVENOUS
  Filled 2022-03-09: qty 5

## 2022-03-09 MED ORDER — SODIUM CHLORIDE 0.9 % IV SOLN: 2.0000 g | Freq: Every day | INTRAVENOUS | Status: DC

## 2022-03-09 MED ORDER — LACTULOSE 10 GM/15ML PO SOLN
20.0000 g | Freq: Three times a day (TID) | ORAL | Status: DC
  Administered 2022-03-09: 20 g via ORAL
  Filled 2022-03-09: qty 30

## 2022-03-09 MED ORDER — MOMETASONE FURO-FORMOTEROL FUM 100-5 MCG/ACT IN AERO
2.0000 | INHALATION_SPRAY | Freq: Two times a day (BID) | RESPIRATORY_TRACT | Status: DC
  Filled 2022-03-09: qty 8.8

## 2022-03-09 MED ORDER — ALLOPURINOL 100 MG PO TABS
100.0000 mg | ORAL_TABLET | Freq: Two times a day (BID) | ORAL | Status: DC
  Administered 2022-03-09: 100 mg via ORAL
  Filled 2022-03-09 (×2): qty 1

## 2022-03-09 MED ORDER — SODIUM CHLORIDE 0.9 % IV SOLN: 10.0000 mL/h | Freq: Once | INTRAVENOUS | Status: DC

## 2022-03-09 MED ORDER — IPRATROPIUM-ALBUTEROL 0.5-2.5 (3) MG/3ML IN SOLN
3.0000 mL | Freq: Once | RESPIRATORY_TRACT | Status: AC
  Administered 2022-03-09: 3 mL via RESPIRATORY_TRACT
  Filled 2022-03-09: qty 3

## 2022-03-09 MED ORDER — METHYLPREDNISOLONE SODIUM SUCC 125 MG IJ SOLR
125.0000 mg | Freq: Once | INTRAMUSCULAR | Status: AC
  Administered 2022-03-09: 125 mg via INTRAVENOUS
  Filled 2022-03-09: qty 2

## 2022-03-09 MED ORDER — SODIUM CHLORIDE 0.9 % IV SOLN
1.0000 g | Freq: Once | INTRAVENOUS | Status: AC
  Administered 2022-03-09: 1 g via INTRAVENOUS
  Filled 2022-03-09: qty 10

## 2022-03-09 MED ORDER — CLONAZEPAM 0.5 MG PO TABS: 0.5000 mg | ORAL_TABLET | Freq: Two times a day (BID) | ORAL | Status: DC | PRN

## 2022-03-09 MED ORDER — FOLIC ACID 1 MG PO TABS
1.0000 mg | ORAL_TABLET | Freq: Every day | ORAL | Status: DC
  Administered 2022-03-09: 1 mg via ORAL
  Filled 2022-03-09: qty 1

## 2022-03-09 NOTE — Assessment & Plan Note (Signed)
Smoking cessation was discussed with patient in detail He declines a nicotine transdermal patch

## 2022-03-09 NOTE — Assessment & Plan Note (Signed)
Continue as needed bronchodilator therapy Place patient on inhaled steroid

## 2022-03-09 NOTE — ED Notes (Signed)
Patient to Ct at this time

## 2022-03-09 NOTE — ED Provider Notes (Signed)
[facility] Provider Note    Event Date/Time   First MD Initiated Contact with Patient 03/09/22 [phone]     (approximate)  History   Chief Complaint: Shortness of Breath  HPI  Raymond Erickson is a 66 y.o. male with a past medical alcohol use, COPD, hypertension, hyperlipidemia, presents to the emergency department for shortness of breath.  According to the patient over the past 10 days or so he has been coughing and feeling short of breath at times.  Patient states he is a daily smoker but has not been smoking over the past week or so.  Patient states he has been coughing occasionally with mucus production.  Patient states he was coughing this morning he tried to drink some honey and lemon, which caused him to cough even more which led to his ER visit today.  Currently the patient appears well he does have mild expiratory wheeze on exam.  Vital signs are reassuring including a 95% room air saturation.  Physical Exam   Triage Vital Signs: ED Triage Vitals  Enc Vitals Group     BP 03/09/22 0948 (!) 149/66     Pulse Rate 03/09/22 0948 72     Resp 03/09/22 0948 20     Temp 03/09/22 0948 97.8 F (36.6 C)     Temp Source 03/09/22 0948 Oral     SpO2 03/09/22 0948 95 %     Weight 03/09/22 0937 267 lb 6.7 oz (121.3 kg)     Height 03/09/22 0937 6\' 1"  (1.854 m)     Head Circumference --      Peak Flow --      Pain Score 03/09/22 0937 0     Pain Loc --      Pain Edu? --      Excl. in GC? --     Most recent vital signs: Vitals:   03/09/22 0948  BP: (!) 149/66  Pulse: 72  Resp: 20  Temp: 97.8 F (36.6 C)  SpO2: 95%    General: Awake, no distress.  CV:  Good peripheral perfusion.  Regular rate and rhythm  Resp:  Normal effort.  Mild expiratory wheeze bilaterally.  No rales or rhonchi. Abd:  No distention.  Soft, nontender.  No rebound or guarding.   ED Results / Procedures / Treatments   EKG  EKG viewed and interpreted by myself shows a normal  sinus rhythm at 73 bpm with a narrow QRS, normal axis, normal intervals, no concerning ST changes.  RADIOLOGY  I have reviewed and interpreted the x-ray images.  Patient appears to have some slight consolidations bilaterally. Radiology has read the x-ray is dense right middle lobe consolidation concerning for pneumonia.   MEDICATIONS ORDERED IN ED: Medications  ipratropium-albuterol (DUONEB) 0.5-2.5 (3) MG/3ML nebulizer solution 3 mL (has no administration in time range)  ipratropium-albuterol (DUONEB) 0.5-2.5 (3) MG/3ML nebulizer solution 3 mL (has no administration in time range)  methylPREDNISolone sodium succinate (SOLU-MEDROL) 125 mg/2 mL injection 125 mg (has no administration in time range)     IMPRESSION / MDM / ASSESSMENT AND PLAN / ED COURSE  I reviewed the triage vital signs and the nursing notes.  Patient's presentation is most consistent with acute presentation with potential threat to life or bodily function.  Patient presents to the emergency department for shortness of breath and cough ongoing over the past 10 days.  Overall the patient appears well reassuring vitals, does have mild expiratory wheeze on exam.  Patient  is a daily smoker, possibly with undiagnosed COPD.  We will dose DuoNebs x2 and Solu-Medrol.  We will check labs including cardiac enzymes obtain a chest x-ray and a COVID swab.  Differential would include COPD exacerbation, pneumonia, viral infection such as COVID, bronchitis.  Patient's labs have resulted showing a critically low hemoglobin.  This has been repeated and confirmed to be very low around 4.1.  We will transfuse 2 units of packed red blood cells to start.  I performed a rectal exam showed light brown stool guaiac negative.  Patient's chest x-ray is concerning for right middle lobe pneumonia.  Patient has a history of cough x10 days with shortness of breath which also fits the diagnosis of pneumonia.  Patient states sputum production.  We will send  blood cultures and start the patient on IV antibiotics for community-acquired pneumonia.  Patient's COVID/flu test is negative.  Chemistry is reassuring.  Troponin negative.  We will admit to the hospital service for ongoing work-up and management.  CRITICAL CARE Performed by: Harvest Dark   Total critical care time: 30 minutes  Critical care time was exclusive of separately billable procedures and treating other patients.  Critical care was necessary to treat or prevent imminent or life-threatening deterioration.  Critical care was time spent personally by me on the following activities: development of treatment plan with patient and/or surrogate as well as nursing, discussions with consultants, evaluation of patient's response to treatment, examination of patient, obtaining history from patient or surrogate, ordering and performing treatments and interventions, ordering and review of laboratory studies, ordering and review of radiographic studies, pulse oximetry and re-evaluation of patient's condition.   FINAL CLINICAL IMPRESSION(S) / ED DIAGNOSES   Dyspnea Cough   Note:  This document was prepared using Dragon voice recognition software and may include unintentional dictation errors.   Harvest Dark, MD 03/09/22 1235

## 2022-03-09 NOTE — Progress Notes (Signed)
       CROSS COVER NOTE  NAME: Raymond Erickson MRN: [id number] DOB : Jul 02, 1955                                                                                                                   Against Medical Advice  Patient at this time expresses desire to leave the Hospital immediately, patient has been warned that this is not Medically advisable at this time, and can result in Medical complications like Death and Disability. Specifically discussed symptomatic anemia and pending GI consult tomorrow. Patient has full decision making capacity and understands and accepts the risks involved and assumes full responsibilty of this decision. He reports he normally receives blood transfusions and is discharged home from the ED, he needs to work and feels he can follow up with PCP.  This patient has also been advised that if they feel the need for further medical assistance to return to the closest ER or dial 9-1-1.  Neomia Glass MHA, MSN, FNP-BC Nurse Practitioner [place]

## 2022-03-09 NOTE — Assessment & Plan Note (Signed)
With evidence of portal hypertension Continue propranolol and lactulose

## 2022-03-09 NOTE — H&P (Addendum)
History and Physical    Patient: Raymond Erickson VWU:[id number] DOB: 05-Apr-1956 DOA: 03/09/2022 DOS: the patient was seen and examined on 03/09/2022 PCP: Baxter Hire, MD  Patient coming from: Home  Chief Complaint:  Chief Complaint  Patient presents with   Shortness of Breath   HPI: Raymond Erickson is a 66 y.o. male with medical history significant for alcoholic liver cirrhosis with portal hypertension, essential hypertension, COPD, nicotine dependence, arthritis, history of anemia requiring frequent blood transfusions, who presents to the ER for evaluation of a productive cough and shortness of breath for 10 days. Patient states that he had a sick contact at work and since then has had a cough productive of occasional greenish phlegm and some wheezing and nasal congestion but denies having any fever or chills.  He had tried some home remedies without any improvement in his symptoms.  He complains of shortness of breath mostly with exertion as well as increased fatigue and thinks that it is about time for him to get a blood transfusion.  He usually gets blood transfusion at that cancer center for iron deficiency anemia. He denies having any chest pain, no nausea, no vomiting, no hematemesis, no hematochezia, no abdominal pain, no hematuria, no urinary symptoms, no fever, no chills, no leg swelling, no headache, no blurred vision, no focal deficits. Labs show hemoglobin of 4.1, platelet count of 91 and serum iron level of 11 Stool is guaiac negative Order has been written for transfusion of 2 units of packed RBC He will be admitted to the hospital for further evaluation    Review of Systems: As mentioned in the history of present illness. All other systems reviewed and are negative. Past Medical History:  Diagnosis Date   Alcohol abuse    Arthritis    Cirrhosis of liver ([place])    COPD with asthma    Essential tremor    Gout    Gout    Hematuria    HLD (hyperlipidemia)     HTN (hypertension)    Past Surgical History:  Procedure Laterality Date   broken nose     HERNIA REPAIR     Social History:  reports that he has been smoking. He has never used smokeless tobacco. He reports current alcohol use of about 100.0 standard drinks of alcohol per week. He reports that he does not use drugs.  Allergies  Allergen Reactions   Ace Inhibitors Swelling   Lisinopril Swelling   Spironolactone Other (See Comments)    gynecomastia    Family History  Problem Relation Age of Onset   Cirrhosis Brother     Prior to Admission medications   Medication Sig Start Date End Date Taking? Authorizing Provider  allopurinol (ZYLOPRIM) 100 MG tablet Take 100 mg by mouth 2 (two) times daily.     [provider]  amLODipine (NORVASC) 5 MG tablet Take 0.5 tablets (2.5 mg total) by mouth daily. 12/29/17   Bettey Costa, MD  blood glucose meter kit and supplies KIT Dispense based on patient and insurance preference. Use up to four times daily as directed. (FOR ICD-9 250.00, 250.01). 12/29/17   Bettey Costa, MD  clonazePAM (KLONOPIN) 0.5 MG tablet Take 0.5 mg by mouth 2 (two) times daily as needed for anxiety.  01/21/15   [provider]  CONSTULOSE 10 GM/15ML solution Take 30 mLs by mouth 3 (three) times daily. 12/03/18   [provider]  ferrous sulfate 325 (65 FE) MG EC tablet Take 1 tablet (  325 mg total) by mouth 2 (two) times daily. 06/30/20 07/30/20  Vanessa West [place], MD  folic acid (FOLVITE) 1 MG tablet Take 1 mg by mouth daily. 12/07/17   [provider]  gabapentin (NEURONTIN) 100 MG capsule Take 100 mg by mouth 3 (three) times daily. 07/18/18 07/18/19  [provider]  glipiZIDE (GLUCOTROL) 5 MG tablet Take 0.5 tablets (2.5 mg total) by mouth daily before breakfast for 14 days. 06/30/20 07/14/20  Vanessa [place], MD  insulin aspart (NOVOLOG) 100 UNIT/ML injection Inject 0-15 Units into the skin 3 (three) times daily with meals. CBG 70 - 120: 0 units  CBG  121 - 150: 2 units  CBG 151 - 200: 3 units  CBG 201 - 250: 5 units  CBG 251 - 300: 8 units  CBG 301 - 350: 11 units  CBG 351 - 400: 15 units  CBG > 400 call MD Patient not taking: Reported on 12/12/2018 12/30/17   Bettey Costa, MD  insulin aspart (NOVOLOG) 100 unit/mL injection Inject 8 Units into the skin 3 (three) times daily before meals. Patient not taking: Reported on 12/12/2018 12/30/17   Bettey Costa, MD  insulin aspart (NOVOLOG) 100 UNIT/ML injection Inject 0-5 Units into the skin at bedtime. CBG 70 - 120: 0 units  CBG 121 - 150: 0 units  CBG 151 - 200: 0 units  CBG 201 - 250: 2 units  CBG 251 - 300: 3 units  CBG 301 - 350: 4 units  CBG 351 - 400: 5 units  CBG > 400 call MD Patient not taking: Reported on 12/12/2018 12/30/17   Bettey Costa, MD  insulin glargine (LANTUS) 100 UNIT/ML injection Inject 0.35 mLs (35 Units total) into the skin daily. Patient not taking: Reported on 12/12/2018 12/31/17   Bettey Costa, MD  nicotine (NICODERM CQ - DOSED IN MG/24 HOURS) 14 mg/24hr patch Place 1 patch (14 mg total) onto the skin daily. Patient not taking: Reported on 12/12/2018 12/29/17   Bettey Costa, MD  omeprazole (PRILOSEC) 20 MG capsule Take 20 mg by mouth 2 (two) times daily. 07/22/16   [provider]  propranolol (INDERAL) 40 MG tablet Take 40 mg by mouth 2 (two) times daily.  01/17/14   [provider]    Physical Exam: Vitals:   03/09/22 0948 03/09/22 1048 03/09/22 1130 03/09/22 1300  BP: (!) 149/66 (!) 140/64 (!) 149/70 (!) 152/64  Pulse: 72 71 73 74  Resp: 20 20 20 17   Temp: 97.8 F (36.6 C)     TempSrc: Oral     SpO2: 95% 100% 90% 95%  Weight:      Height:       Physical Exam Vitals and nursing note reviewed.  Constitutional:      Appearance: He is obese.  HENT:     Head: Normocephalic and atraumatic.  Cardiovascular:     Rate and Rhythm: Normal rate and regular rhythm.  Pulmonary:     Effort: Pulmonary effort is normal.     Breath sounds: Examination of the  right-middle field reveals rhonchi. Rhonchi present.     Comments: Scattered expiratory wheezes Abdominal:     General: Bowel sounds are normal.     Palpations: Abdomen is soft.  Musculoskeletal:        General: Normal range of motion.     Cervical back: Normal range of motion and neck supple.  Skin:    General: Skin is warm and dry.  Neurological:     General:  No focal deficit present.     Mental Status: He is alert.  Psychiatric:        Mood and Affect: Mood normal.        Behavior: Behavior normal.     Data Reviewed: Relevant notes from primary care and specialist visits, past discharge summaries as available in EHR, including Care Everywhere. Prior diagnostic testing as pertinent to current admission diagnoses Updated medications and problem lists for reconciliation ED course, including vitals, labs, imaging, treatment and response to treatment Triage notes, nursing and pharmacy notes and ED provider's notes Notable results as noted in HPI Labs reviewed.  Iron level, total iron binding capacity 477, troponin 19, hemoglobin 4.1, hematocrit 18.5, white count 5.6, MCV 57.5, sodium 137, potassium 3.6, chloride 109, bicarb 20, glucose 128, BUN 11, creatinine 1.04, calcium 8.5, total protein 7.1, albumin 3.4, AST 31, ALT 15, alkaline phosphatase 64, total bilirubin 0.8 Respiratory viral panel is negative Chest x-ray reviewed by me shows a dense right middle lobe consolidation. Twelve-lead EKG reviewed by me shows normal sinus rhythm There are no new results to review at this time.  Assessment and Plan: * Symptomatic anemia Patient with complaints of exertional shortness of breath and fatigue and found to have a hemoglobin of 4.1 Has been receiving blood transfusion at the cancer center for chronic blood loss anemia of unclear etiology, last blood transfusion was on 07/30/21 Had an upper endoscopy done in 2019 which showed type I gastric varices, nonbleeding erosive gastropathy and  nonbleeding angiectasia's and a flexible sigmoidoscopy which showed polyps but no bleeding source. Patient was supposed to follow-up with GI as an outpatient for capsule endoscopy Obtain iron studies Transfuse 2 units of packed RBC GI consult   CAP (community acquired pneumonia) Patient presents to the ER for evaluation of a 10-day history of a productive cough, wheezing and new shortness of breath. Chest x-ray shows a dense right middle lobe consolidation We will place patient empirically on Rocephin and Zithromax  Nicotine dependence Smoking cessation was discussed with patient in detail He declines a nicotine transdermal patch  Diabetes mellitus ([place]) Place patient on a full liquid diet Glycemic control with sliding scale insulin  ALC (alcoholic liver cirrhosis) (HCC) With evidence of portal hypertension Continue propranolol and lactulose  Chronic obstructive pulmonary disease (HCC) Continue as needed bronchodilator therapy Place patient on inhaled steroid  Thrombocytopenia (HCC) Secondary to alcoholic liver disease with portal hypertension No evidence of bleeding at this time Monitor closely during this hospitalization      Advance Care Planning:   Code Status: Full Code   Consults: Gastroenterology  Family Communication: Greater than 50% of time was spent discussing patient's condition and plan of care with him at the bedside.  All questions and concerns have been addressed.  He verbalizes understanding and agrees to the plan.  Severity of Illness: The appropriate patient status for this patient is INPATIENT. Inpatient status is judged to be reasonable and necessary in order to provide the required intensity of service to ensure the patient's safety. The patient's presenting symptoms, physical exam findings, and initial radiographic and laboratory data in the context of their chronic comorbidities is felt to place them at high risk for further clinical deterioration.  Furthermore, it is not anticipated that the patient will be medically stable for discharge from the hospital within 2 midnights of admission.   * I certify that at the point of admission it is my clinical judgment that the patient will require inpatient hospital care  spanning beyond 2 midnights from the point of admission due to high intensity of service, high risk for further deterioration and high frequency of surveillance required.*  Author: Collier Bullock, MD 03/09/2022 1:50 PM  For on call review [email or web address].

## 2022-03-09 NOTE — Assessment & Plan Note (Signed)
Patient presents to the ER for evaluation of a 10-day history of a productive cough, wheezing and new shortness of breath. Chest x-ray shows a dense right middle lobe consolidation We will place patient empirically on Rocephin and Zithromax

## 2022-03-09 NOTE — ED Notes (Signed)
Patient placed on 2L O2 at this time. O2 in mid 80s. O2 now 96% on 2L.

## 2022-03-09 NOTE — ED Notes (Addendum)
Critical result hemoglobin 4.1. Paduchowski, MD made aware.

## 2022-03-09 NOTE — ED Notes (Signed)
[place] NP at bedside.

## 2022-03-09 NOTE — ED Notes (Signed)
O2 turned off at this time. Patient is 97% on room air.

## 2022-03-09 NOTE — Assessment & Plan Note (Signed)
Place patient on a full liquid diet Glycemic control with sliding scale insulin

## 2022-03-09 NOTE — ED Notes (Signed)
Pt signed AMA.  Voiced understanding and able to tell this RN about his follow up appt scheduled.  Pt left in stable condition ambulatory with spouse.

## 2022-03-09 NOTE — ED Triage Notes (Signed)
First Nurse Note:  C/O cough and SOB x 1 week - 10 days.  States cough is productive.  Denies fever/chills.  AAOx3.  Skin warm and dry.  DOE noted.

## 2022-03-09 NOTE — Assessment & Plan Note (Signed)
Secondary to alcoholic liver disease with portal hypertension No evidence of bleeding at this time Monitor closely during this hospitalization

## 2022-03-09 NOTE — Assessment & Plan Note (Signed)
Patient with complaints of exertional shortness of breath and fatigue and found to have a hemoglobin of 4.1 Has been receiving blood transfusion at the cancer center for chronic blood loss anemia of unclear etiology, last blood transfusion was on 07/30/21 Had an upper endoscopy done in 2019 which showed type I gastric varices, nonbleeding erosive gastropathy and nonbleeding angiectasia's and a flexible sigmoidoscopy which showed polyps but no bleeding source. Patient was supposed to follow-up with GI as an outpatient for capsule endoscopy Obtain iron studies Transfuse 2 units of packed RBC GI consult

## 2022-03-09 NOTE — ED Notes (Signed)
Pt signed paper consent for blood due to pen pad not working. Copy placed in chart.

## 2022-03-09 NOTE — ED Notes (Signed)
Report received, care assumed.  Assessment completed, denies problems with RBC infusion... rate increased to 226ml/hr.  Vitals obtained.

## 2022-03-09 NOTE — ED Notes (Signed)
2nd unit of RBC completed at this time.  Vitals are as documented.

## 2022-03-09 NOTE — Assessment & Plan Note (Signed)
Smoking cessation has been discussed with patient in detail He declines a nicotine transdermal patch at this time 

## 2022-03-09 NOTE — ED Notes (Addendum)
Pt's blood infusion completed. He reports he was unaware that he was admitted and wants to go home.  He requests to speak to the provider before signing the [place] form.  Message sent to Holley Bouche NP who responded via text.

## 2022-03-09 NOTE — ED Notes (Signed)
Paper form AMA signed.

## 2022-03-10 LAB — TYPE AND SCREEN
ABO/RH(D): O POS
Antibody Screen: NEGATIVE
Unit division: 0
Unit division: 0

## 2022-03-10 LAB — BPAM RBC
Blood Product Expiration Date: 202310032359
Blood Product Expiration Date: 202311042359
ISSUE DATE / TIME: 202310021419
ISSUE DATE / TIME: 202310021743
Unit Type and Rh: 5100
Unit Type and Rh: 9500

## 2022-03-10 LAB — HEMOGLOBIN A1C
Hgb A1c MFr Bld: 5 % (ref 4.8–5.6)
Mean Plasma Glucose: 96.8 mg/dL

## 2022-03-10 LAB — HIV ANTIBODY (ROUTINE TESTING W REFLEX): HIV Screen 4th Generation wRfx: NONREACTIVE

## 2022-03-14 LAB — CULTURE, BLOOD (ROUTINE X 2)
Culture: NO GROWTH
Culture: NO GROWTH

## 2022-07-10 ENCOUNTER — Other Ambulatory Visit: Payer: Self-pay | Admitting: *Deleted

## 2022-07-10 ENCOUNTER — Encounter: Payer: Self-pay | Admitting: Oncology

## 2022-07-10 DIAGNOSIS — D509 Iron deficiency anemia, unspecified: Secondary | ICD-10-CM

## 2022-08-12 ENCOUNTER — Inpatient Hospital Stay: Payer: BC Managed Care – PPO

## 2022-08-13 ENCOUNTER — Inpatient Hospital Stay: Payer: BC Managed Care – PPO

## 2022-08-13 ENCOUNTER — Inpatient Hospital Stay: Payer: BC Managed Care – PPO | Admitting: Oncology

## 2022-08-18 ENCOUNTER — Other Ambulatory Visit: Payer: BC Managed Care – PPO

## 2022-08-19 ENCOUNTER — Ambulatory Visit: Payer: BC Managed Care – PPO | Admitting: Oncology

## 2022-08-19 ENCOUNTER — Ambulatory Visit: Payer: BC Managed Care – PPO

## 2022-09-21 ENCOUNTER — Inpatient Hospital Stay (HOSPITAL_BASED_OUTPATIENT_CLINIC_OR_DEPARTMENT_OTHER): Payer: BC Managed Care – PPO | Admitting: Oncology

## 2022-09-21 ENCOUNTER — Telehealth: Payer: Self-pay | Admitting: *Deleted

## 2022-09-21 ENCOUNTER — Inpatient Hospital Stay: Payer: BC Managed Care – PPO

## 2022-09-21 ENCOUNTER — Other Ambulatory Visit: Payer: Self-pay | Admitting: Oncology

## 2022-09-21 ENCOUNTER — Inpatient Hospital Stay: Payer: BC Managed Care – PPO | Attending: Oncology

## 2022-09-21 ENCOUNTER — Other Ambulatory Visit: Payer: Self-pay | Admitting: *Deleted

## 2022-09-21 ENCOUNTER — Ambulatory Visit: Payer: BC Managed Care – PPO

## 2022-09-21 VITALS — BP 135/70 | HR 59 | Temp 97.2°F | Resp 16

## 2022-09-21 DIAGNOSIS — D509 Iron deficiency anemia, unspecified: Secondary | ICD-10-CM

## 2022-09-21 DIAGNOSIS — F17210 Nicotine dependence, cigarettes, uncomplicated: Secondary | ICD-10-CM | POA: Diagnosis not present

## 2022-09-21 LAB — CBC WITH DIFFERENTIAL/PLATELET
Abs Immature Granulocytes: 0.09 10*3/uL — ABNORMAL HIGH (ref 0.00–0.07)
Basophils Absolute: 0.1 10*3/uL (ref 0.0–0.1)
Basophils Relative: 2 %
Eosinophils Absolute: 0.2 10*3/uL (ref 0.0–0.5)
Eosinophils Relative: 6 %
HCT: 19.6 % — ABNORMAL LOW (ref 39.0–52.0)
Hemoglobin: 4.9 g/dL — CL (ref 13.0–17.0)
Immature Granulocytes: 2 %
Lymphocytes Relative: 42 %
Lymphs Abs: 1.5 10*3/uL (ref 0.7–4.0)
MCH: 14.4 pg — ABNORMAL LOW (ref 26.0–34.0)
MCHC: 25 g/dL — ABNORMAL LOW (ref 30.0–36.0)
MCV: 57.6 fL — ABNORMAL LOW (ref 80.0–100.0)
Monocytes Absolute: 0.4 10*3/uL (ref 0.1–1.0)
Monocytes Relative: 9 %
Neutro Abs: 1.5 10*3/uL — ABNORMAL LOW (ref 1.7–7.7)
Neutrophils Relative %: 39 %
Platelets: 77 10*3/uL — ABNORMAL LOW (ref 150–400)
RBC: 3.4 MIL/uL — ABNORMAL LOW (ref 4.22–5.81)
RDW: 22.6 % — ABNORMAL HIGH (ref 11.5–15.5)
Smear Review: DECREASED
WBC: 3.7 10*3/uL — ABNORMAL LOW (ref 4.0–10.5)
nRBC: 0.5 % — ABNORMAL HIGH (ref 0.0–0.2)

## 2022-09-21 LAB — TYPE AND SCREEN
ABO/RH(D): O POS
Antibody Screen: NEGATIVE
Unit division: 0

## 2022-09-21 LAB — BPAM RBC
Blood Product Expiration Date: 202405212359
ISSUE DATE / TIME: 202404151344
Unit Type and Rh: 5100
Unit Type and Rh: 5100

## 2022-09-21 LAB — IRON AND TIBC
Iron: 11 ug/dL — ABNORMAL LOW (ref 45–182)
Saturation Ratios: 3 % — ABNORMAL LOW (ref 17.9–39.5)
TIBC: 445 ug/dL (ref 250–450)
UIBC: 434 ug/dL

## 2022-09-21 LAB — PREPARE RBC (CROSSMATCH)

## 2022-09-21 LAB — FERRITIN: Ferritin: 2 ng/mL — ABNORMAL LOW (ref 24–336)

## 2022-09-21 MED ORDER — DIPHENHYDRAMINE HCL 50 MG/ML IJ SOLN
25.0000 mg | Freq: Once | INTRAMUSCULAR | Status: AC
  Administered 2022-09-21: 25 mg via INTRAVENOUS
  Filled 2022-09-21: qty 1

## 2022-09-21 MED ORDER — SODIUM CHLORIDE 0.9% IV SOLUTION
250.0000 mL | Freq: Once | INTRAVENOUS | Status: AC
  Administered 2022-09-21: 250 mL via INTRAVENOUS
  Filled 2022-09-21: qty 250

## 2022-09-21 MED ORDER — ACETAMINOPHEN 325 MG PO TABS
650.0000 mg | ORAL_TABLET | Freq: Once | ORAL | Status: AC
  Administered 2022-09-21: 650 mg via ORAL
  Filled 2022-09-21: qty 2

## 2022-09-21 MED FILL — Iron Sucrose Inj 20 MG/ML (Fe Equiv): INTRAVENOUS | Qty: 10 | Status: AC

## 2022-09-21 NOTE — Telephone Encounter (Addendum)
09/21/22- at 0911-RN called cancer lab to receive Critical value from [facility]. Read back process performed; Hgb 4.9.  Read back process performed with Dr. Orlie Dakin - 09/21/22 at 0912 am. Per Dr. Orlie Dakin- patient will need 2 units of blood. Patient has not been evaluated in cancer center in almost a year. Reviewed chart- patient has had a critical low hgb of 4.1 in the last 6 months. He also received blood inpatient at [facility] in February 2024. I called blood bank to confirm if patient has a h/o in blood bank. Per [facility] in blood bank- confirmed no ABO is needed-pt had blood at [facility] several years ago.

## 2022-09-21 NOTE — Patient Instructions (Signed)
Blood Transfusion, Adult A blood transfusion is a procedure in which you receive blood through an IV tube. You may need this procedure because of: A bleeding disorder. An illness. An injury. A surgery. The blood may come from someone else (a donor). You may also be able to donate blood for yourself before a surgery. The blood given in a transfusion may be made up of different types of cells. You may get: Red blood cells. These carry oxygen to the cells in the body. Platelets. These help your blood to clot. Plasma. This is the liquid part of your blood. It carries proteins and other substances through the body. White blood cells. These help you fight infections. If you have a clotting disorder, you may also get other types of blood products. Depending on the type of blood product, this procedure may take 1-4 hours to complete. Tell your doctor about: Any bleeding problems you have. Any reactions you have had during a blood transfusion in the past. Any allergies you have. All medicines you are taking, including vitamins, herbs, eye drops, creams, and over-the-counter medicines. Any surgeries you have had. Any medical conditions you have. Whether you are pregnant or may be pregnant. What are the risks? Talk with your health care provider about risks. The most common problems include: A mild allergic reaction. This includes red, swollen areas of skin (hives) and itching. Fever or chills. This may be the body's response to new blood cells received. This may happen during or up to 4 hours after the transfusion. More serious problems may include: A serious allergic reaction. This includes breathing trouble or swelling around the face and lips. Too much fluid in the lungs. This may cause breathing problems. Lung injury. This causes breathing trouble and low oxygen in the blood. This can happen within hours of the transfusion or days later. Too much iron. This can happen after getting many blood  transfusions over a period of time. An infection or virus passed through the blood. This is rare. Donated blood is carefully tested before it is given. Your body's defense system (immune system) trying to attack the new blood cells. This is rare. Symptoms may include fever, chills, nausea, low blood pressure, and low back or chest pain. Donated cells attacking healthy tissues. This is rare. What happens before the procedure? You will have a blood test to find out your blood type. The test also finds out what type of blood your body will accept and matches it to the donor type. If you are going to have a planned surgery, you may be able to donate your own blood. This may be done in case you need a transfusion. You will have your temperature, blood pressure, and pulse checked. You may receive medicine to help prevent an allergic reaction. This may be done if you have had a reaction to a transfusion before. This medicine may be given to you by mouth or through an IV tube. What happens during the procedure?  An IV tube will be put into one of your veins. The bag of blood will be attached to your IV tube. Then, the blood will enter through your vein. Your temperature, blood pressure, and pulse will be checked often. This is done to find early signs of a transfusion reaction. Tell your nurse right away if you have any of these symptoms: Shortness of breath or trouble breathing. Chest or back pain. Fever or chills. Red, swollen areas of skin or itching. If you have any signs   or symptoms of a reaction, your transfusion will be stopped. You may also be given medicine. When the transfusion is finished, your IV tube will be taken out. Pressure may be put on the IV site for a few minutes. A bandage (dressing) will be put on the IV site. The procedure may vary among doctors and hospitals. What happens after the procedure? You will be monitored until you leave the hospital or clinic. This includes  checking your temperature, blood pressure, pulse, breathing rate, and blood oxygen level. Your blood may be tested to see how you have responded to the transfusion. You may be warmed with fluids or blankets. This is done to keep the temperature of your body normal. If you have your procedure in an outpatient setting, you will be told whom to contact to report any reactions. Where to find more information Visit the [employer]: redcross.org Summary A blood transfusion is a procedure in which you receive blood through an IV tube. The blood you are given may be made up of different blood cells. You may receive red blood cells, platelets, plasma, or white blood cells. Your temperature, blood pressure, and pulse will be checked often. After the procedure, your blood may be tested to see how you have responded. This information is not intended to replace advice given to you by your health care provider. Make sure you discuss any questions you have with your health care provider. Document Revised: 08/22/2021 Document Reviewed: 08/22/2021 Elsevier Patient Education  2023 Elsevier Inc.  

## 2022-09-21 NOTE — Progress Notes (Signed)
[facility]  [phone]) [phone] Fax:(336) [phone]  ID: Raymond Erickson OB: June 26, 1955  MR#: [id number]  ION#:[id number]  Patient Care Team: Gracelyn Nurse, MD as PCP - General (Internal Medicine)  CHIEF COMPLAINT: Iron deficiency anemia.  INTERVAL HISTORY: Patient last seen in clinic in March 2023.  Patient to have persistent anemia requiring multiple transfusions.  He continues to work full-time despite a hemoglobin of 4.9.  He has increased fatigue, but otherwise feels well.  He has no neurologic complaints.  He denies any recent fevers or illnesses.  He has a good appetite and denies weight loss.  He has no chest pain, shortness of breath, cough, or hemoptysis.  He denies any nausea, vomiting, constipation, or diarrhea.  He has no melena or hematochezia.  He has no urinary complaints.  Patient offers no further specific complaints today.  REVIEW OF SYSTEMS:   Review of Systems  Constitutional:  Positive for malaise/fatigue. Negative for fever and weight loss.  Respiratory: Negative.  Negative for cough, hemoptysis and shortness of breath.   Cardiovascular: Negative.  Negative for chest pain and leg swelling.  Gastrointestinal: Negative.  Negative for abdominal pain, blood in stool and melena.  Genitourinary: Negative.  Negative for dysuria and hematuria.  Musculoskeletal: Negative.   Skin: Negative.  Negative for rash.  Neurological: Negative.  Negative for dizziness, focal weakness, weakness and headaches.  Psychiatric/Behavioral: Negative.  The patient is not nervous/anxious.     As per HPI. Otherwise, a complete review of systems is negative.  PAST MEDICAL HISTORY: Past Medical History:  Diagnosis Date   Alcohol abuse    Arthritis    Cirrhosis of liver (HCC)    COPD with asthma    Essential tremor    Gout    Gout    Hematuria    HLD (hyperlipidemia)    HTN (hypertension)     PAST SURGICAL HISTORY: Past Surgical History:  Procedure  Laterality Date   broken nose     HERNIA REPAIR      FAMILY HISTORY: Family History  Problem Relation Age of Onset   Cirrhosis Brother     ADVANCED DIRECTIVES (Y/N):  N  HEALTH MAINTENANCE: Social History   Tobacco Use   Smoking status: Every Day   Smokeless tobacco: Never  Vaping Use   Vaping Use: Never used  Substance Use Topics   Alcohol use: Yes    Alcohol/week: 100.0 standard drinks of alcohol    Types: 100 Standard drinks or equivalent per week   Drug use: No     Colonoscopy:  PAP:  Bone density:  Lipid panel:  Allergies  Allergen Reactions   Ace Inhibitors Swelling   Lisinopril Swelling   Spironolactone Other (See Comments)    gynecomastia    Current Outpatient Medications  Medication Sig Dispense Refill   allopurinol (ZYLOPRIM) 100 MG tablet Take 100 mg by mouth 2 (two) times daily.      amLODipine (NORVASC) 5 MG tablet Take 0.5 tablets (2.5 mg total) by mouth daily.     blood glucose meter kit and supplies KIT Dispense based on patient and insurance preference. Use up to four times daily as directed. (FOR ICD-9 250.00, 250.01). 1 each 0   clonazePAM (KLONOPIN) 0.5 MG tablet Take 0.5 mg by mouth 2 (two) times daily as needed for anxiety.      CONSTULOSE 10 GM/15ML solution Take 30 mLs by mouth 3 (three) times daily.     ferrous sulfate 325 (65 FE) MG EC  tablet Take 1 tablet (325 mg total) by mouth 2 (two) times daily. (Patient not taking: Reported on 03/09/2022) 60 tablet 0   folic acid (FOLVITE) 1 MG tablet Take 1 mg by mouth daily.  0   gabapentin (NEURONTIN) 100 MG capsule Take 100 mg by mouth 3 (three) times daily.     glipiZIDE (GLUCOTROL) 5 MG tablet Take 0.5 tablets (2.5 mg total) by mouth daily before breakfast for 14 days. 7 tablet 0   insulin aspart (NOVOLOG) 100 UNIT/ML injection Inject 0-15 Units into the skin 3 (three) times daily with meals. CBG 70 - 120: 0 units  CBG 121 - 150: 2 units  CBG 151 - 200: 3 units  CBG 201 - 250: 5 units  CBG  251 - 300: 8 units  CBG 301 - 350: 11 units  CBG 351 - 400: 15 units  CBG > 400 call MD (Patient not taking: Reported on 12/12/2018) 10 mL 0   insulin aspart (NOVOLOG) 100 unit/mL injection Inject 8 Units into the skin 3 (three) times daily before meals. (Patient not taking: Reported on 12/12/2018) 1 vial 12   insulin aspart (NOVOLOG) 100 UNIT/ML injection Inject 0-5 Units into the skin at bedtime. CBG 70 - 120: 0 units  CBG 121 - 150: 0 units  CBG 151 - 200: 0 units  CBG 201 - 250: 2 units  CBG 251 - 300: 3 units  CBG 301 - 350: 4 units  CBG 351 - 400: 5 units  CBG > 400 call MD (Patient not taking: Reported on 12/12/2018) 10 mL 0   insulin glargine (LANTUS) 100 UNIT/ML injection Inject 0.35 mLs (35 Units total) into the skin daily. (Patient not taking: Reported on 12/12/2018) 10 mL 11   Ipratropium-Albuterol (COMBIVENT) 20-100 MCG/ACT AERS respimat Inhale 1 puff into the lungs 4 (four) times daily as needed.     nicotine (NICODERM CQ - DOSED IN MG/24 HOURS) 14 mg/24hr patch Place 1 patch (14 mg total) onto the skin daily. (Patient not taking: Reported on 12/12/2018) 28 patch 0   omeprazole (PRILOSEC) 20 MG capsule Take 20 mg by mouth 2 (two) times daily.  1   propranolol (INDERAL) 40 MG tablet Take 40 mg by mouth 2 (two) times daily.      pyridOXINE (VITAMIN B6) 25 MG tablet Take 25 mg by mouth daily.     No current facility-administered medications for this visit.    OBJECTIVE: Vitals:   09/21/22 1333 09/21/22 1340  BP: 133/61 135/70  Pulse: (!) 59 (!) 59  Resp: 18 16  Temp: (!) 97 F (36.1 C) (!) 97.2 F (36.2 C)  SpO2: 100% 100%     There is no height or weight on file to calculate BMI.    ECOG FS:0 - Asymptomatic  General: Well-developed, well-nourished, no acute distress. Eyes: Pink conjunctiva, anicteric sclera. HEENT: Normocephalic, moist mucous membranes. Lungs: No audible wheezing or coughing. Heart: Regular rate and rhythm. Abdomen: Soft, nontender, no obvious  distention. Musculoskeletal: No edema, cyanosis, or clubbing. Neuro: Alert, answering all questions appropriately. Cranial nerves grossly intact. Skin: No rashes or petechiae noted. Psych: Normal affect.   LAB RESULTS:  Lab Results  Component Value Date   NA 137 03/09/2022   K 3.6 03/09/2022   CL 109 03/09/2022   CO2 20 (L) 03/09/2022   GLUCOSE 128 (H) 03/09/2022   BUN 11 03/09/2022   CREATININE 1.04 03/09/2022   CALCIUM 8.5 (L) 03/09/2022   PROT 7.1 03/09/2022   ALBUMIN  3.4 (L) 03/09/2022   AST 31 03/09/2022   ALT 15 03/09/2022   ALKPHOS 64 03/09/2022   BILITOT 0.8 03/09/2022   GFRNONAA >60 03/09/2022   GFRAA >60 12/13/2018    Lab Results  Component Value Date   WBC 3.7 (L) 09/21/2022   NEUTROABS 1.5 (L) 09/21/2022   HGB 4.9 (LL) 09/21/2022   HCT 19.6 (L) 09/21/2022   MCV 57.6 (L) 09/21/2022   PLT 77 (L) 09/21/2022   Lab Results  Component Value Date   IRON 11 (L) 09/21/2022   TIBC 445 09/21/2022   IRONPCTSAT 3 (L) 09/21/2022   Lab Results  Component Value Date   FERRITIN 2 (L) 09/21/2022     STUDIES: No results found.  ASSESSMENT: Iron deficiency anemia.  PLAN:    Iron deficiency anemia: Secondary to GI blood loss.  Patient reports colonoscopy, EGD, and video capsule endoscopy at outside facility did not reveal any distinct source.  Patient's hemoglobin is significantly decreased at 4.9 today with decreased iron stores.  Proceed with 2 units of packed red blood cells today.  Patient then return to clinic in 2 weeks with repeat laboratory work, further evaluation, and consideration of additional blood or possible IV Venofer.  I spent a total of 30 minutes reviewing chart data, face-to-face evaluation with the patient, counseling and coordination of care as detailed above.   Patient expressed understanding and was in agreement with this plan. He also understands that He can call clinic at any time with any questions, concerns, or complaints.    Jeralyn Ruths, MD   09/21/2022 3:51 PM

## 2022-09-22 ENCOUNTER — Inpatient Hospital Stay: Payer: BC Managed Care – PPO | Admitting: Oncology

## 2022-09-22 ENCOUNTER — Inpatient Hospital Stay: Payer: BC Managed Care – PPO

## 2022-09-22 LAB — TYPE AND SCREEN: Unit division: 0

## 2022-09-22 LAB — BPAM RBC
Blood Product Expiration Date: 202405212359
ISSUE DATE / TIME: 202404151202

## 2022-10-02 ENCOUNTER — Other Ambulatory Visit: Payer: Self-pay

## 2022-10-02 DIAGNOSIS — D509 Iron deficiency anemia, unspecified: Secondary | ICD-10-CM

## 2022-10-05 ENCOUNTER — Inpatient Hospital Stay (HOSPITAL_BASED_OUTPATIENT_CLINIC_OR_DEPARTMENT_OTHER): Payer: BC Managed Care – PPO | Admitting: Oncology

## 2022-10-05 ENCOUNTER — Inpatient Hospital Stay: Payer: BC Managed Care – PPO

## 2022-10-05 ENCOUNTER — Encounter: Payer: Self-pay | Admitting: Oncology

## 2022-10-05 VITALS — BP 152/71 | HR 68 | Temp 96.8°F | Resp 16 | Ht 73.0 in | Wt 254.0 lb

## 2022-10-05 DIAGNOSIS — D509 Iron deficiency anemia, unspecified: Secondary | ICD-10-CM | POA: Diagnosis not present

## 2022-10-05 LAB — PREPARE RBC (CROSSMATCH)

## 2022-10-05 LAB — CBC WITH DIFFERENTIAL/PLATELET
Abs Immature Granulocytes: 0.02 10*3/uL (ref 0.00–0.07)
Basophils Absolute: 0.1 10*3/uL (ref 0.0–0.1)
Basophils Relative: 1 %
Eosinophils Absolute: 0.2 10*3/uL (ref 0.0–0.5)
Eosinophils Relative: 5 %
HCT: 24.7 % — ABNORMAL LOW (ref 39.0–52.0)
Hemoglobin: 6.3 g/dL — CL (ref 13.0–17.0)
Immature Granulocytes: 1 %
Lymphocytes Relative: 27 %
Lymphs Abs: 1.2 10*3/uL (ref 0.7–4.0)
MCH: 15.8 pg — ABNORMAL LOW (ref 26.0–34.0)
MCHC: 25.5 g/dL — ABNORMAL LOW (ref 30.0–36.0)
MCV: 61.8 fL — ABNORMAL LOW (ref 80.0–100.0)
Monocytes Absolute: 0.4 10*3/uL (ref 0.1–1.0)
Monocytes Relative: 8 %
Neutro Abs: 2.5 10*3/uL (ref 1.7–7.7)
Neutrophils Relative %: 58 %
Platelets: 68 10*3/uL — ABNORMAL LOW (ref 150–400)
RBC: 4 MIL/uL — ABNORMAL LOW (ref 4.22–5.81)
RDW: 26.1 % — ABNORMAL HIGH (ref 11.5–15.5)
WBC: 4.4 10*3/uL (ref 4.0–10.5)
nRBC: 0 % (ref 0.0–0.2)

## 2022-10-05 LAB — SAMPLE TO BLOOD BANK

## 2022-10-05 LAB — TYPE AND SCREEN

## 2022-10-05 LAB — BPAM RBC: Blood Product Expiration Date: 202405292359

## 2022-10-05 MED FILL — Iron Sucrose Inj 20 MG/ML (Fe Equiv): INTRAVENOUS | Qty: 10 | Status: AC

## 2022-10-05 NOTE — Addendum Note (Signed)
Addended by: Luz Lex on: 10/05/2022 01:14 PM   Modules accepted: Orders

## 2022-10-05 NOTE — Progress Notes (Signed)
[facility]  Telephone:(336) [phone] Fax:(336) [phone]  ID: Raymond Erickson OB: 07-29-1955  MR#: [id number]  AOZ#:[id number]  Patient Care Team: Gracelyn Nurse, MD as PCP - General (Internal Medicine)  CHIEF COMPLAINT: Iron deficiency anemia.  INTERVAL HISTORY: Patient returns to clinic today for repeat laboratory work, further evaluation, consideration of additional blood.  He feels significantly improved after receiving 2 units packed red blood cells approximately 2 weeks ago.  He continues to complain of fatigue though.  He has no neurologic complaints.  He denies any recent fevers or illnesses.  He has a good appetite and denies weight loss.  He has no chest pain, shortness of breath, cough, or hemoptysis.  He denies any nausea, vomiting, constipation, or diarrhea.  He has no melena or hematochezia.  He has no urinary complaints.  Patient offers no further specific complaints today.  REVIEW OF SYSTEMS:   Review of Systems  Constitutional:  Positive for malaise/fatigue. Negative for fever and weight loss.  Respiratory: Negative.  Negative for cough, hemoptysis and shortness of breath.   Cardiovascular: Negative.  Negative for chest pain and leg swelling.  Gastrointestinal: Negative.  Negative for abdominal pain, blood in stool and melena.  Genitourinary: Negative.  Negative for dysuria and hematuria.  Musculoskeletal: Negative.   Skin: Negative.  Negative for rash.  Neurological: Negative.  Negative for dizziness, focal weakness, weakness and headaches.  Psychiatric/Behavioral: Negative.  The patient is not nervous/anxious.     As per HPI. Otherwise, a complete review of systems is negative.  PAST MEDICAL HISTORY: Past Medical History:  Diagnosis Date   Alcohol abuse    Arthritis    Cirrhosis of liver (HCC)    COPD with asthma    Essential tremor    Gout    Gout    Hematuria    HLD (hyperlipidemia)    HTN (hypertension)     PAST SURGICAL  HISTORY: Past Surgical History:  Procedure Laterality Date   broken nose     HERNIA REPAIR      FAMILY HISTORY: Family History  Problem Relation Age of Onset   Cirrhosis Brother     ADVANCED DIRECTIVES (Y/N):  N  HEALTH MAINTENANCE: Social History   Tobacco Use   Smoking status: Every Day   Smokeless tobacco: Never  Vaping Use   Vaping Use: Never used  Substance Use Topics   Alcohol use: Yes    Alcohol/week: 100.0 standard drinks of alcohol    Types: 100 Standard drinks or equivalent per week   Drug use: No     Colonoscopy:  PAP:  Bone density:  Lipid panel:  Allergies  Allergen Reactions   Ace Inhibitors Swelling   Lisinopril Swelling   Spironolactone Other (See Comments)    gynecomastia    Current Outpatient Medications  Medication Sig Dispense Refill   allopurinol (ZYLOPRIM) 100 MG tablet Take 100 mg by mouth 2 (two) times daily.      amLODipine (NORVASC) 5 MG tablet Take 0.5 tablets (2.5 mg total) by mouth daily.     blood glucose meter kit and supplies KIT Dispense based on patient and insurance preference. Use up to four times daily as directed. (FOR ICD-9 250.00, 250.01). 1 each 0   clonazePAM (KLONOPIN) 0.5 MG tablet Take 0.5 mg by mouth 2 (two) times daily as needed for anxiety.      CONSTULOSE 10 GM/15ML solution Take 30 mLs by mouth 3 (three) times daily.     folic acid (FOLVITE) 1  MG tablet Take 1 mg by mouth daily.  0   gabapentin (NEURONTIN) 100 MG capsule Take 100 mg by mouth 3 (three) times daily.     omeprazole (PRILOSEC) 20 MG capsule Take 20 mg by mouth 2 (two) times daily.  1   propranolol (INDERAL) 40 MG tablet Take 40 mg by mouth 2 (two) times daily.      pyridOXINE (VITAMIN B6) 25 MG tablet Take 25 mg by mouth daily.     Vitamin D, Ergocalciferol, (DRISDOL) 1.25 MG (50000 UNIT) CAPS capsule Take 50,000 Units by mouth once a week.     ferrous sulfate 325 (65 FE) MG EC tablet Take 1 tablet (325 mg total) by mouth 2 (two) times daily.  (Patient not taking: Reported on 03/09/2022) 60 tablet 0   glipiZIDE (GLUCOTROL) 5 MG tablet Take 0.5 tablets (2.5 mg total) by mouth daily before breakfast for 14 days. (Patient not taking: Reported on 10/05/2022) 7 tablet 0   insulin aspart (NOVOLOG) 100 UNIT/ML injection Inject 0-15 Units into the skin 3 (three) times daily with meals. CBG 70 - 120: 0 units  CBG 121 - 150: 2 units  CBG 151 - 200: 3 units  CBG 201 - 250: 5 units  CBG 251 - 300: 8 units  CBG 301 - 350: 11 units  CBG 351 - 400: 15 units  CBG > 400 call MD (Patient not taking: Reported on 12/12/2018) 10 mL 0   insulin aspart (NOVOLOG) 100 unit/mL injection Inject 8 Units into the skin 3 (three) times daily before meals. (Patient not taking: Reported on 12/12/2018) 1 vial 12   insulin aspart (NOVOLOG) 100 UNIT/ML injection Inject 0-5 Units into the skin at bedtime. CBG 70 - 120: 0 units  CBG 121 - 150: 0 units  CBG 151 - 200: 0 units  CBG 201 - 250: 2 units  CBG 251 - 300: 3 units  CBG 301 - 350: 4 units  CBG 351 - 400: 5 units  CBG > 400 call MD (Patient not taking: Reported on 12/12/2018) 10 mL 0   insulin glargine (LANTUS) 100 UNIT/ML injection Inject 0.35 mLs (35 Units total) into the skin daily. (Patient not taking: Reported on 12/12/2018) 10 mL 11   Ipratropium-Albuterol (COMBIVENT) 20-100 MCG/ACT AERS respimat Inhale 1 puff into the lungs 4 (four) times daily as needed. (Patient not taking: Reported on 10/05/2022)     nicotine (NICODERM CQ - DOSED IN MG/24 HOURS) 14 mg/24hr patch Place 1 patch (14 mg total) onto the skin daily. (Patient not taking: Reported on 12/12/2018) 28 patch 0   No current facility-administered medications for this visit.    OBJECTIVE: Vitals:   10/05/22 0933  BP: (!) 152/71  Pulse: 68  Resp: 16  Temp: (!) 96.8 F (36 C)  SpO2: 100%     Body mass index is 33.51 kg/m.    ECOG FS:0 - Asymptomatic  General: Well-developed, well-nourished, no acute distress. Eyes: Pink conjunctiva, anicteric  sclera. HEENT: Normocephalic, moist mucous membranes. Lungs: No audible wheezing or coughing. Heart: Regular rate and rhythm. Abdomen: Soft, nontender, no obvious distention. Musculoskeletal: No edema, cyanosis, or clubbing. Neuro: Alert, answering all questions appropriately. Cranial nerves grossly intact. Skin: No rashes or petechiae noted. Psych: Normal affect.  LAB RESULTS:  Lab Results  Component Value Date   NA 137 03/09/2022   K 3.6 03/09/2022   CL 109 03/09/2022   CO2 20 (L) 03/09/2022   GLUCOSE 128 (H) 03/09/2022   BUN 11 03/09/2022  CREATININE 1.04 03/09/2022   CALCIUM 8.5 (L) 03/09/2022   PROT 7.1 03/09/2022   ALBUMIN 3.4 (L) 03/09/2022   AST 31 03/09/2022   ALT 15 03/09/2022   ALKPHOS 64 03/09/2022   BILITOT 0.8 03/09/2022   GFRNONAA >60 03/09/2022   GFRAA >60 12/13/2018    Lab Results  Component Value Date   WBC 4.4 10/05/2022   NEUTROABS 2.5 10/05/2022   HGB 6.3 (LL) 10/05/2022   HCT 24.7 (L) 10/05/2022   MCV 61.8 (L) 10/05/2022   PLT 68 (L) 10/05/2022   Lab Results  Component Value Date   IRON 11 (L) 09/21/2022   TIBC 445 09/21/2022   IRONPCTSAT 3 (L) 09/21/2022   Lab Results  Component Value Date   FERRITIN 2 (L) 09/21/2022     STUDIES: No results found.  ASSESSMENT: Iron deficiency anemia.  PLAN:    Iron deficiency anemia: Possibly secondary to GI blood loss.  Patient reports colonoscopy, EGD, and video capsule endoscopy at [facility] due to worsening did not reveal distinct source, but have recommended recontacting them for further evaluation.  Patient's hemoglobin remains significantly decreased at 6.3 but improved from previous.  Return to clinic on Wednesday for 1 additional unit packed red blood cells.  Patient will then return to clinic in 4 weeks for further evaluation and consideration of IV iron.    I spent a total of 30 minutes reviewing chart data, face-to-face evaluation with the patient, counseling and coordination of care as  detailed above.    Patient expressed understanding and was in agreement with this plan. He also understands that He can call clinic at any time with any questions, concerns, or complaints.    Jeralyn Ruths, MD   10/05/2022 12:19 PM

## 2022-10-06 ENCOUNTER — Inpatient Hospital Stay: Payer: BC Managed Care – PPO

## 2022-10-06 ENCOUNTER — Inpatient Hospital Stay: Payer: BC Managed Care – PPO | Admitting: Oncology

## 2022-10-06 MED FILL — Iron Sucrose Inj 20 MG/ML (Fe Equiv): INTRAVENOUS | Qty: 10 | Status: AC

## 2022-10-07 ENCOUNTER — Inpatient Hospital Stay: Payer: BC Managed Care – PPO | Attending: Oncology

## 2022-10-07 DIAGNOSIS — D509 Iron deficiency anemia, unspecified: Secondary | ICD-10-CM | POA: Diagnosis present

## 2022-10-07 LAB — BPAM RBC
Blood Product Expiration Date: 202405292359
ISSUE DATE / TIME: 202405011409

## 2022-10-07 LAB — TYPE AND SCREEN: ABO/RH(D): O POS

## 2022-10-07 MED ORDER — SODIUM CHLORIDE 0.9% IV SOLUTION
250.0000 mL | Freq: Once | INTRAVENOUS | Status: AC
  Administered 2022-10-07: 250 mL via INTRAVENOUS
  Filled 2022-10-07: qty 250

## 2022-10-07 MED ORDER — DIPHENHYDRAMINE HCL 50 MG/ML IJ SOLN
25.0000 mg | Freq: Once | INTRAMUSCULAR | Status: AC
  Administered 2022-10-07: 25 mg via INTRAVENOUS
  Filled 2022-10-07: qty 1

## 2022-10-07 MED ORDER — ACETAMINOPHEN 325 MG PO TABS
650.0000 mg | ORAL_TABLET | Freq: Once | ORAL | Status: AC
  Administered 2022-10-07: 650 mg via ORAL
  Filled 2022-10-07: qty 2

## 2022-10-07 NOTE — Patient Instructions (Signed)

## 2022-10-08 LAB — TYPE AND SCREEN
Antibody Screen: NEGATIVE
Unit division: 0

## 2022-10-08 LAB — BPAM RBC

## 2022-10-30 MED FILL — Iron Sucrose Inj 20 MG/ML (Fe Equiv): INTRAVENOUS | Qty: 10 | Status: AC

## 2022-11-03 ENCOUNTER — Inpatient Hospital Stay: Payer: BC Managed Care – PPO | Admitting: Oncology

## 2022-11-03 ENCOUNTER — Inpatient Hospital Stay: Payer: BC Managed Care – PPO

## 2022-11-09 MED FILL — Iron Sucrose Inj 20 MG/ML (Fe Equiv): INTRAVENOUS | Qty: 10 | Status: AC

## 2022-11-10 ENCOUNTER — Encounter: Payer: Self-pay | Admitting: Oncology

## 2022-11-10 ENCOUNTER — Inpatient Hospital Stay: Payer: BC Managed Care – PPO

## 2022-11-10 ENCOUNTER — Inpatient Hospital Stay (HOSPITAL_BASED_OUTPATIENT_CLINIC_OR_DEPARTMENT_OTHER): Payer: BC Managed Care – PPO | Admitting: Oncology

## 2022-11-10 ENCOUNTER — Inpatient Hospital Stay: Payer: BC Managed Care – PPO | Attending: Oncology

## 2022-11-10 VITALS — BP 176/76 | HR 69 | Temp 97.0°F | Resp 16 | Ht 73.0 in | Wt 252.0 lb

## 2022-11-10 DIAGNOSIS — D509 Iron deficiency anemia, unspecified: Secondary | ICD-10-CM | POA: Insufficient documentation

## 2022-11-10 DIAGNOSIS — F17210 Nicotine dependence, cigarettes, uncomplicated: Secondary | ICD-10-CM | POA: Diagnosis not present

## 2022-11-10 LAB — CBC WITH DIFFERENTIAL (CANCER CENTER ONLY)
Abs Immature Granulocytes: 0.02 10*3/uL (ref 0.00–0.07)
Basophils Absolute: 0 10*3/uL (ref 0.0–0.1)
Basophils Relative: 1 %
Eosinophils Absolute: 0.2 10*3/uL (ref 0.0–0.5)
Eosinophils Relative: 5 %
HCT: 22.3 % — ABNORMAL LOW (ref 39.0–52.0)
Hemoglobin: 5.7 g/dL — CL (ref 13.0–17.0)
Immature Granulocytes: 0 %
Lymphocytes Relative: 34 %
Lymphs Abs: 1.7 10*3/uL (ref 0.7–4.0)
MCH: 15.2 pg — ABNORMAL LOW (ref 26.0–34.0)
MCHC: 25.6 g/dL — ABNORMAL LOW (ref 30.0–36.0)
MCV: 59.3 fL — ABNORMAL LOW (ref 80.0–100.0)
Monocytes Absolute: 0.4 10*3/uL (ref 0.1–1.0)
Monocytes Relative: 8 %
Neutro Abs: 2.5 10*3/uL (ref 1.7–7.7)
Neutrophils Relative %: 52 %
Platelet Count: 72 10*3/uL — ABNORMAL LOW (ref 150–400)
RBC: 3.76 MIL/uL — ABNORMAL LOW (ref 4.22–5.81)
RDW: 23.9 % — ABNORMAL HIGH (ref 11.5–15.5)
WBC Count: 4.8 10*3/uL (ref 4.0–10.5)
nRBC: 0 % (ref 0.0–0.2)

## 2022-11-10 LAB — SAMPLE TO BLOOD BANK

## 2022-11-10 LAB — IRON AND TIBC
Iron: 23 ug/dL — ABNORMAL LOW (ref 45–182)
Saturation Ratios: 5 % — ABNORMAL LOW (ref 17.9–39.5)
TIBC: 463 ug/dL — ABNORMAL HIGH (ref 250–450)
UIBC: 440 ug/dL

## 2022-11-10 LAB — FERRITIN: Ferritin: 2 ng/mL — ABNORMAL LOW (ref 24–336)

## 2022-11-10 NOTE — Progress Notes (Signed)
[facility]  Telephone:(336) [phone] Fax:(336) [phone]  ID: Raymond Erickson OB: 01/09/1956  MR#: [id number]  AOZ#:[id number]  Patient Care Team: Gracelyn Nurse, MD as PCP - General (Internal Medicine)  CHIEF COMPLAINT: Iron deficiency anemia.  INTERVAL HISTORY: Patient returns to clinic today for repeat laboratory work, further evaluation, and consideration of blood transfusion.  He continues to remain active and works full-time.  He does not complain of any weakness or fatigue.  He has no neurologic complaints.  He denies any recent fevers or illnesses.  He has a good appetite and denies weight loss.  He has no chest pain, shortness of breath, cough, or hemoptysis.  He denies any nausea, vomiting, constipation, or diarrhea.  He has no melena or hematochezia.  He has no urinary complaints.  Patient offers no specific complaints today.  REVIEW OF SYSTEMS:   Review of Systems  Constitutional: Negative.  Negative for fever, malaise/fatigue and weight loss.  Respiratory: Negative.  Negative for cough, hemoptysis and shortness of breath.   Cardiovascular: Negative.  Negative for chest pain and leg swelling.  Gastrointestinal: Negative.  Negative for abdominal pain, blood in stool and melena.  Genitourinary: Negative.  Negative for dysuria and hematuria.  Musculoskeletal: Negative.   Skin: Negative.  Negative for rash.  Neurological: Negative.  Negative for dizziness, focal weakness, weakness and headaches.  Psychiatric/Behavioral: Negative.  The patient is not nervous/anxious.     As per HPI. Otherwise, a complete review of systems is negative.  PAST MEDICAL HISTORY: Past Medical History:  Diagnosis Date   Alcohol abuse    Arthritis    Cirrhosis of liver (HCC)    COPD with asthma    Essential tremor    Gout    Gout    Hematuria    HLD (hyperlipidemia)    HTN (hypertension)     PAST SURGICAL HISTORY: Past Surgical History:  Procedure Laterality Date    broken nose     HERNIA REPAIR      FAMILY HISTORY: Family History  Problem Relation Age of Onset   Cirrhosis Brother     ADVANCED DIRECTIVES (Y/N):  N  HEALTH MAINTENANCE: Social History   Tobacco Use   Smoking status: Every Day   Smokeless tobacco: Never  Vaping Use   Vaping Use: Never used  Substance Use Topics   Alcohol use: Yes    Alcohol/week: 100.0 standard drinks of alcohol    Types: 100 Standard drinks or equivalent per week   Drug use: No     Colonoscopy:  PAP:  Bone density:  Lipid panel:  Allergies  Allergen Reactions   Ace Inhibitors Swelling   Lisinopril Swelling   Spironolactone Other (See Comments)    gynecomastia    Current Outpatient Medications  Medication Sig Dispense Refill   allopurinol (ZYLOPRIM) 100 MG tablet Take 100 mg by mouth 2 (two) times daily.      amLODipine (NORVASC) 5 MG tablet Take 0.5 tablets (2.5 mg total) by mouth daily.     blood glucose meter kit and supplies KIT Dispense based on patient and insurance preference. Use up to four times daily as directed. (FOR ICD-9 250.00, 250.01). 1 each 0   clonazePAM (KLONOPIN) 0.5 MG tablet Take 0.5 mg by mouth 2 (two) times daily as needed for anxiety.      CONSTULOSE 10 GM/15ML solution Take 30 mLs by mouth 3 (three) times daily.     folic acid (FOLVITE) 1 MG tablet Take 1 mg by mouth  daily.  0   omeprazole (PRILOSEC) 20 MG capsule Take 20 mg by mouth 2 (two) times daily.  1   propranolol (INDERAL) 40 MG tablet Take 40 mg by mouth 2 (two) times daily.      pyridOXINE (VITAMIN B6) 25 MG tablet Take 25 mg by mouth daily.     Vitamin D, Ergocalciferol, (DRISDOL) 1.25 MG (50000 UNIT) CAPS capsule Take 50,000 Units by mouth once a week.     gabapentin (NEURONTIN) 100 MG capsule Take 100 mg by mouth 3 (three) times daily. (Patient not taking: Reported on 11/10/2022)     glipiZIDE (GLUCOTROL) 5 MG tablet Take 0.5 tablets (2.5 mg total) by mouth daily before breakfast for 14 days. (Patient not  taking: Reported on 10/05/2022) 7 tablet 0   insulin aspart (NOVOLOG) 100 UNIT/ML injection Inject 0-15 Units into the skin 3 (three) times daily with meals. CBG 70 - 120: 0 units  CBG 121 - 150: 2 units  CBG 151 - 200: 3 units  CBG 201 - 250: 5 units  CBG 251 - 300: 8 units  CBG 301 - 350: 11 units  CBG 351 - 400: 15 units  CBG > 400 call MD (Patient not taking: Reported on 12/12/2018) 10 mL 0   insulin aspart (NOVOLOG) 100 unit/mL injection Inject 8 Units into the skin 3 (three) times daily before meals. (Patient not taking: Reported on 12/12/2018) 1 vial 12   insulin aspart (NOVOLOG) 100 UNIT/ML injection Inject 0-5 Units into the skin at bedtime. CBG 70 - 120: 0 units  CBG 121 - 150: 0 units  CBG 151 - 200: 0 units  CBG 201 - 250: 2 units  CBG 251 - 300: 3 units  CBG 301 - 350: 4 units  CBG 351 - 400: 5 units  CBG > 400 call MD (Patient not taking: Reported on 12/12/2018) 10 mL 0   insulin glargine (LANTUS) 100 UNIT/ML injection Inject 0.35 mLs (35 Units total) into the skin daily. (Patient not taking: Reported on 12/12/2018) 10 mL 11   Ipratropium-Albuterol (COMBIVENT) 20-100 MCG/ACT AERS respimat Inhale 1 puff into the lungs 4 (four) times daily as needed. (Patient not taking: Reported on 10/05/2022)     nicotine (NICODERM CQ - DOSED IN MG/24 HOURS) 14 mg/24hr patch Place 1 patch (14 mg total) onto the skin daily. (Patient not taking: Reported on 12/12/2018) 28 patch 0   No current facility-administered medications for this visit.    OBJECTIVE: Vitals:   11/10/22 1521  BP: (!) 176/76  Pulse: 69  Resp: 16  Temp: (!) 97 F (36.1 C)  SpO2: 100%     Body mass index is 33.25 kg/m.    ECOG FS:0 - Asymptomatic  General: Well-developed, well-nourished, no acute distress. Eyes: Pink conjunctiva, anicteric sclera. HEENT: Normocephalic, moist mucous membranes. Lungs: No audible wheezing or coughing. Heart: Regular rate and rhythm. Abdomen: Soft, nontender, no obvious  distention. Musculoskeletal: No edema, cyanosis, or clubbing. Neuro: Alert, answering all questions appropriately. Cranial nerves grossly intact. Skin: No rashes or petechiae noted. Psych: Normal affect.  LAB RESULTS:  Lab Results  Component Value Date   NA 137 03/09/2022   K 3.6 03/09/2022   CL 109 03/09/2022   CO2 20 (L) 03/09/2022   GLUCOSE 128 (H) 03/09/2022   BUN 11 03/09/2022   CREATININE 1.04 03/09/2022   CALCIUM 8.5 (L) 03/09/2022   PROT 7.1 03/09/2022   ALBUMIN 3.4 (L) 03/09/2022   AST 31 03/09/2022   ALT 15 03/09/2022  ALKPHOS 64 03/09/2022   BILITOT 0.8 03/09/2022   GFRNONAA >60 03/09/2022   GFRAA >60 12/13/2018    Lab Results  Component Value Date   WBC 4.8 11/10/2022   NEUTROABS 2.5 11/10/2022   HGB 5.7 (LL) 11/10/2022   HCT 22.3 (L) 11/10/2022   MCV 59.3 (L) 11/10/2022   PLT 72 (L) 11/10/2022   Lab Results  Component Value Date   IRON 23 (L) 11/10/2022   TIBC 463 (H) 11/10/2022   IRONPCTSAT 5 (L) 11/10/2022   Lab Results  Component Value Date   FERRITIN 2 (L) 11/10/2022     STUDIES: No results found.  ASSESSMENT: Iron deficiency anemia.  PLAN:    Iron deficiency anemia: Possibly secondary to GI blood loss.  Patient reports colonoscopy, EGD, and video capsule endoscopy at [facility] did not reveal distinct source, but have recommended recontacting them for further evaluation.  Patient's hemoglobin remains significantly decreased at 5.7.  Iron stores are also significantly reduced.  Patient cannot get off work this week for transfusion, but feels he will have a day off at the end of June to receive blood.  He will call clinic when he can come back in for transfusion.  Follow-up will be based on patient's work schedule.     I spent a total of 30 minutes reviewing chart data, face-to-face evaluation with the patient, counseling and coordination of care as detailed above.   Patient expressed understanding and was in agreement with this plan. He also  understands that He can call clinic at any time with any questions, concerns, or complaints.    Jeralyn Ruths, MD   11/10/2022 4:37 PM

## 2022-11-13 ENCOUNTER — Telehealth: Payer: Self-pay

## 2022-11-13 NOTE — Telephone Encounter (Signed)
Spoke with patients friend Alexia Freestone about getting patient scheduled for blood transfusion. She is going to talk back with patient about getting labs done on 6/25 and coming in for the blood transfusion 6/26. She said she will LVM if nobody is here.

## 2022-11-25 ENCOUNTER — Other Ambulatory Visit: Payer: Self-pay

## 2022-11-25 ENCOUNTER — Other Ambulatory Visit: Payer: Self-pay | Admitting: Oncology

## 2022-11-25 ENCOUNTER — Inpatient Hospital Stay: Payer: BC Managed Care – PPO

## 2022-11-25 ENCOUNTER — Other Ambulatory Visit: Payer: BC Managed Care – PPO

## 2022-11-25 DIAGNOSIS — D509 Iron deficiency anemia, unspecified: Secondary | ICD-10-CM

## 2022-11-25 LAB — CBC WITH DIFFERENTIAL (CANCER CENTER ONLY)
Abs Immature Granulocytes: 0.02 10*3/uL (ref 0.00–0.07)
Basophils Absolute: 0.1 10*3/uL (ref 0.0–0.1)
Basophils Relative: 1 %
Eosinophils Absolute: 0.3 10*3/uL (ref 0.0–0.5)
Eosinophils Relative: 5 %
HCT: 19.8 % — ABNORMAL LOW (ref 39.0–52.0)
Hemoglobin: 5 g/dL — CL (ref 13.0–17.0)
Immature Granulocytes: 0 %
Lymphocytes Relative: 32 %
Lymphs Abs: 1.6 10*3/uL (ref 0.7–4.0)
MCH: 14.7 pg — ABNORMAL LOW (ref 26.0–34.0)
MCHC: 25.3 g/dL — ABNORMAL LOW (ref 30.0–36.0)
MCV: 58.4 fL — ABNORMAL LOW (ref 80.0–100.0)
Monocytes Absolute: 0.5 10*3/uL (ref 0.1–1.0)
Monocytes Relative: 9 %
Neutro Abs: 2.8 10*3/uL (ref 1.7–7.7)
Neutrophils Relative %: 53 %
Platelet Count: 91 10*3/uL — ABNORMAL LOW (ref 150–400)
RBC: 3.39 MIL/uL — ABNORMAL LOW (ref 4.22–5.81)
RDW: 22.4 % — ABNORMAL HIGH (ref 11.5–15.5)
WBC Count: 5.2 10*3/uL (ref 4.0–10.5)
nRBC: 0 % (ref 0.0–0.2)

## 2022-11-25 LAB — TYPE AND SCREEN
Antibody Screen: NEGATIVE
Unit division: 0
Unit division: 0

## 2022-11-25 LAB — BPAM RBC: Unit Type and Rh: 5100

## 2022-11-25 LAB — PREPARE RBC (CROSSMATCH)

## 2022-11-26 ENCOUNTER — Inpatient Hospital Stay: Payer: BC Managed Care – PPO

## 2022-11-26 DIAGNOSIS — D509 Iron deficiency anemia, unspecified: Secondary | ICD-10-CM

## 2022-11-26 LAB — TYPE AND SCREEN

## 2022-11-26 LAB — BPAM RBC: ISSUE DATE / TIME: 202406201129

## 2022-11-26 MED ORDER — DIPHENHYDRAMINE HCL 50 MG/ML IJ SOLN
25.0000 mg | Freq: Once | INTRAMUSCULAR | Status: AC
  Administered 2022-11-26: 25 mg via INTRAVENOUS
  Filled 2022-11-26: qty 1

## 2022-11-26 MED ORDER — ACETAMINOPHEN 325 MG PO TABS
650.0000 mg | ORAL_TABLET | Freq: Once | ORAL | Status: AC
  Administered 2022-11-26: 650 mg via ORAL
  Filled 2022-11-26: qty 2

## 2022-11-26 MED ORDER — SODIUM CHLORIDE 0.9% IV SOLUTION
250.0000 mL | Freq: Once | INTRAVENOUS | Status: AC
  Administered 2022-11-26: 250 mL via INTRAVENOUS
  Filled 2022-11-26: qty 250

## 2022-11-27 ENCOUNTER — Encounter: Payer: Self-pay | Admitting: Oncology

## 2022-11-27 LAB — TYPE AND SCREEN: ABO/RH(D): O POS

## 2022-11-27 LAB — BPAM RBC
Blood Product Expiration Date: 202407272359
ISSUE DATE / TIME: 202406200932
Unit Type and Rh: 5100
Unit Type and Rh: 5100

## 2022-12-04 ENCOUNTER — Other Ambulatory Visit: Payer: Self-pay

## 2022-12-04 DIAGNOSIS — D509 Iron deficiency anemia, unspecified: Secondary | ICD-10-CM

## 2022-12-07 ENCOUNTER — Telehealth: Payer: Self-pay | Admitting: *Deleted

## 2022-12-07 ENCOUNTER — Inpatient Hospital Stay: Payer: BC Managed Care – PPO

## 2022-12-07 NOTE — Telephone Encounter (Signed)
Labs and see md and possible blood tx on wed. Pt was agreeable per abby that spoke to pt today

## 2022-12-08 ENCOUNTER — Inpatient Hospital Stay: Payer: BC Managed Care – PPO

## 2022-12-08 ENCOUNTER — Other Ambulatory Visit: Payer: Self-pay | Admitting: Oncology

## 2022-12-08 ENCOUNTER — Inpatient Hospital Stay: Payer: BC Managed Care – PPO | Admitting: Oncology

## 2022-12-08 ENCOUNTER — Inpatient Hospital Stay: Payer: BC Managed Care – PPO | Attending: Oncology

## 2022-12-08 DIAGNOSIS — D509 Iron deficiency anemia, unspecified: Secondary | ICD-10-CM

## 2022-12-08 LAB — CBC WITH DIFFERENTIAL (CANCER CENTER ONLY)
Abs Immature Granulocytes: 0.02 10*3/uL (ref 0.00–0.07)
Basophils Absolute: 0.1 10*3/uL (ref 0.0–0.1)
Basophils Relative: 1 %
Eosinophils Absolute: 0.3 10*3/uL (ref 0.0–0.5)
Eosinophils Relative: 5 %
HCT: 25.6 % — ABNORMAL LOW (ref 39.0–52.0)
Hemoglobin: 6.8 g/dL — CL (ref 13.0–17.0)
Immature Granulocytes: 0 %
Lymphocytes Relative: 28 %
Lymphs Abs: 1.6 10*3/uL (ref 0.7–4.0)
MCH: 16.3 pg — ABNORMAL LOW (ref 26.0–34.0)
MCHC: 26.6 g/dL — ABNORMAL LOW (ref 30.0–36.0)
MCV: 61.5 fL — ABNORMAL LOW (ref 80.0–100.0)
Monocytes Absolute: 0.4 10*3/uL (ref 0.1–1.0)
Monocytes Relative: 8 %
Neutro Abs: 3.3 10*3/uL (ref 1.7–7.7)
Neutrophils Relative %: 58 %
Platelet Count: 79 10*3/uL — ABNORMAL LOW (ref 150–400)
RBC: 4.16 MIL/uL — ABNORMAL LOW (ref 4.22–5.81)
RDW: 26.6 % — ABNORMAL HIGH (ref 11.5–15.5)
WBC Count: 5.7 10*3/uL (ref 4.0–10.5)
nRBC: 0 % (ref 0.0–0.2)

## 2022-12-08 LAB — SAMPLE TO BLOOD BANK

## 2022-12-09 ENCOUNTER — Inpatient Hospital Stay: Payer: BC Managed Care – PPO

## 2022-12-09 ENCOUNTER — Other Ambulatory Visit: Payer: Self-pay | Admitting: *Deleted

## 2022-12-09 ENCOUNTER — Inpatient Hospital Stay: Payer: BC Managed Care – PPO | Admitting: Oncology

## 2022-12-09 DIAGNOSIS — D509 Iron deficiency anemia, unspecified: Secondary | ICD-10-CM

## 2022-12-09 LAB — TYPE AND SCREEN
ABO/RH(D): O POS
Unit division: 0

## 2022-12-09 LAB — PREPARE RBC (CROSSMATCH)

## 2022-12-09 LAB — BPAM RBC: Unit Type and Rh: 5100

## 2022-12-11 ENCOUNTER — Ambulatory Visit: Payer: BC Managed Care – PPO

## 2022-12-11 ENCOUNTER — Inpatient Hospital Stay: Payer: BC Managed Care – PPO

## 2022-12-11 DIAGNOSIS — D509 Iron deficiency anemia, unspecified: Secondary | ICD-10-CM

## 2022-12-11 LAB — TYPE AND SCREEN: Unit division: 0

## 2022-12-11 LAB — BPAM RBC
Blood Product Expiration Date: 202408072359
ISSUE DATE / TIME: 202407050918
Unit Type and Rh: 5100

## 2022-12-11 MED ORDER — SODIUM CHLORIDE 0.9% IV SOLUTION
250.0000 mL | Freq: Once | INTRAVENOUS | Status: AC
  Administered 2022-12-11: 250 mL via INTRAVENOUS
  Filled 2022-12-11: qty 250

## 2022-12-11 MED ORDER — ACETAMINOPHEN 325 MG PO TABS
650.0000 mg | ORAL_TABLET | Freq: Once | ORAL | Status: AC
  Administered 2022-12-11: 650 mg via ORAL
  Filled 2022-12-11: qty 2

## 2022-12-11 MED ORDER — DIPHENHYDRAMINE HCL 50 MG/ML IJ SOLN
25.0000 mg | Freq: Once | INTRAMUSCULAR | Status: AC
  Administered 2022-12-11: 25 mg via INTRAVENOUS
  Filled 2022-12-11: qty 1

## 2022-12-11 MED ORDER — HEPARIN SOD (PORK) LOCK FLUSH 100 UNIT/ML IV SOLN
500.0000 [IU] | Freq: Every day | INTRAVENOUS | Status: DC | PRN
  Filled 2022-12-11: qty 5

## 2022-12-12 LAB — TYPE AND SCREEN: Antibody Screen: NEGATIVE

## 2022-12-12 LAB — BPAM RBC
ISSUE DATE / TIME: 202407051103
Unit Type and Rh: 5100

## 2022-12-14 ENCOUNTER — Other Ambulatory Visit: Payer: Self-pay | Admitting: *Deleted

## 2022-12-14 DIAGNOSIS — D509 Iron deficiency anemia, unspecified: Secondary | ICD-10-CM

## 2023-04-08 ENCOUNTER — Telehealth: Payer: Self-pay | Admitting: *Deleted

## 2023-04-08 ENCOUNTER — Encounter: Payer: Self-pay | Admitting: Oncology

## 2023-04-08 ENCOUNTER — Other Ambulatory Visit: Payer: Self-pay | Admitting: *Deleted

## 2023-04-08 ENCOUNTER — Inpatient Hospital Stay: Payer: Medicare Other | Attending: Oncology

## 2023-04-08 ENCOUNTER — Other Ambulatory Visit: Payer: Self-pay | Admitting: Oncology

## 2023-04-08 DIAGNOSIS — D509 Iron deficiency anemia, unspecified: Secondary | ICD-10-CM

## 2023-04-08 LAB — CBC WITH DIFFERENTIAL/PLATELET
Abs Immature Granulocytes: 0.02 10*3/uL (ref 0.00–0.07)
Basophils Absolute: 0.1 10*3/uL (ref 0.0–0.1)
Basophils Relative: 1 %
Eosinophils Absolute: 0.2 10*3/uL (ref 0.0–0.5)
Eosinophils Relative: 5 %
HCT: 19.8 % — ABNORMAL LOW (ref 39.0–52.0)
Hemoglobin: 4.7 g/dL — CL (ref 13.0–17.0)
Immature Granulocytes: 1 %
Lymphocytes Relative: 35 %
Lymphs Abs: 1.6 10*3/uL (ref 0.7–4.0)
MCH: 12.6 pg — ABNORMAL LOW (ref 26.0–34.0)
MCHC: 23.7 g/dL — ABNORMAL LOW (ref 30.0–36.0)
MCV: 53.2 fL — ABNORMAL LOW (ref 80.0–100.0)
Monocytes Absolute: 0.5 10*3/uL (ref 0.1–1.0)
Monocytes Relative: 10 %
Neutro Abs: 2.2 10*3/uL (ref 1.7–7.7)
Neutrophils Relative %: 48 %
Platelets: 94 10*3/uL — ABNORMAL LOW (ref 150–400)
RBC: 3.72 MIL/uL — ABNORMAL LOW (ref 4.22–5.81)
RDW: 22.7 % — ABNORMAL HIGH (ref 11.5–15.5)
Smear Review: NORMAL
WBC: 4.4 10*3/uL (ref 4.0–10.5)
nRBC: 0 % (ref 0.0–0.2)

## 2023-04-08 LAB — PREPARE RBC (CROSSMATCH)

## 2023-04-08 NOTE — Telephone Encounter (Signed)
Call from patient friend stating that he had labs drawn at [facility] this morning and they just got a call that his HGB is 4.8  and told him t go th ER, but she is asking if he can come here and get blood instead. Please advise.   His only symptoms is that he is sleeping a lot, denies shortness of breath or dizziness.

## 2023-04-09 ENCOUNTER — Inpatient Hospital Stay: Payer: Medicare Other | Attending: Oncology | Admitting: Oncology

## 2023-04-09 ENCOUNTER — Inpatient Hospital Stay: Payer: Medicare Other

## 2023-04-09 DIAGNOSIS — D509 Iron deficiency anemia, unspecified: Secondary | ICD-10-CM | POA: Insufficient documentation

## 2023-04-09 DIAGNOSIS — F17210 Nicotine dependence, cigarettes, uncomplicated: Secondary | ICD-10-CM | POA: Insufficient documentation

## 2023-04-09 MED ORDER — DIPHENHYDRAMINE HCL 50 MG/ML IJ SOLN
25.0000 mg | Freq: Once | INTRAMUSCULAR | Status: AC
Start: 2023-04-09 — End: 2023-04-09
  Administered 2023-04-09: 25 mg via INTRAVENOUS
  Filled 2023-04-09: qty 1

## 2023-04-09 MED ORDER — SODIUM CHLORIDE 0.9% IV SOLUTION
250.0000 mL | INTRAVENOUS | Status: DC
  Administered 2023-04-09: 100 mL via INTRAVENOUS
  Filled 2023-04-09: qty 250

## 2023-04-09 MED ORDER — ACETAMINOPHEN 325 MG PO TABS
650.0000 mg | ORAL_TABLET | Freq: Once | ORAL | Status: AC
  Administered 2023-04-09: 650 mg via ORAL
  Filled 2023-04-09: qty 2

## 2023-04-09 NOTE — Progress Notes (Signed)
[facility]  Telephone:(336) [phone] Fax:(336) [phone]  ID: Raymond Erickson OB: 03-12-1956  MR#: [id number]  HQI#:[id number]  Patient Care Team: Gracelyn Nurse, MD as PCP - General (Internal Medicine) Jeralyn Ruths, MD as Consulting Physician (Oncology)  CHIEF COMPLAINT: Iron deficiency anemia.  INTERVAL HISTORY: Patient was last seen on November 10, 2022.  He is now retired and returns to clinic for further evaluation and consideration of blood transfusion.  He does not complain of any weakness or fatigue.  He has no neurologic complaints.  He denies any recent fevers or illnesses.  He has a good appetite and denies weight loss.  He has no chest pain, shortness of breath, cough, or hemoptysis.  He denies any nausea, vomiting, constipation, or diarrhea.  He has no melena or hematochezia.  He has no urinary complaints.  Patient offers no specific complaints today.  REVIEW OF SYSTEMS:   Review of Systems  Constitutional: Negative.  Negative for fever, malaise/fatigue and weight loss.  Respiratory: Negative.  Negative for cough, hemoptysis and shortness of breath.   Cardiovascular: Negative.  Negative for chest pain and leg swelling.  Gastrointestinal: Negative.  Negative for abdominal pain, blood in stool and melena.  Genitourinary: Negative.  Negative for dysuria and hematuria.  Musculoskeletal: Negative.   Skin: Negative.  Negative for rash.  Neurological: Negative.  Negative for dizziness, focal weakness, weakness and headaches.  Psychiatric/Behavioral: Negative.  The patient is not nervous/anxious.     As per HPI. Otherwise, a complete review of systems is negative.  PAST MEDICAL HISTORY: Past Medical History:  Diagnosis Date   Alcohol abuse    Arthritis    Cirrhosis of liver (HCC)    COPD with asthma    Essential tremor    Gout    Gout    Hematuria    HLD (hyperlipidemia)    HTN (hypertension)     PAST SURGICAL HISTORY: Past Surgical History:   Procedure Laterality Date   broken nose     HERNIA REPAIR      FAMILY HISTORY: Family History  Problem Relation Age of Onset   Cirrhosis Brother     ADVANCED DIRECTIVES (Y/N):  N  HEALTH MAINTENANCE: Social History   Tobacco Use   Smoking status: Every Day   Smokeless tobacco: Never  Vaping Use   Vaping status: Never Used  Substance Use Topics   Alcohol use: Yes    Alcohol/week: 100.0 standard drinks of alcohol    Types: 100 Standard drinks or equivalent per week   Drug use: No     Colonoscopy:  PAP:  Bone density:  Lipid panel:  Allergies  Allergen Reactions   Ace Inhibitors Swelling   Lisinopril Swelling   Spironolactone Other (See Comments)    gynecomastia    Current Outpatient Medications  Medication Sig Dispense Refill   allopurinol (ZYLOPRIM) 100 MG tablet Take 100 mg by mouth 2 (two) times daily.      amLODipine (NORVASC) 5 MG tablet Take 0.5 tablets (2.5 mg total) by mouth daily.     blood glucose meter kit and supplies KIT Dispense based on patient and insurance preference. Use up to four times daily as directed. (FOR ICD-9 250.00, 250.01). 1 each 0   clonazePAM (KLONOPIN) 0.5 MG tablet Take 0.5 mg by mouth 2 (two) times daily as needed for anxiety.      CONSTULOSE 10 GM/15ML solution Take 30 mLs by mouth 3 (three) times daily.     folic acid (FOLVITE)  1 MG tablet Take 1 mg by mouth daily.  0   gabapentin (NEURONTIN) 100 MG capsule Take 100 mg by mouth 3 (three) times daily. (Patient not taking: Reported on 11/10/2022)     glipiZIDE (GLUCOTROL) 5 MG tablet Take 0.5 tablets (2.5 mg total) by mouth daily before breakfast for 14 days. (Patient not taking: Reported on 10/05/2022) 7 tablet 0   insulin aspart (NOVOLOG) 100 UNIT/ML injection Inject 0-15 Units into the skin 3 (three) times daily with meals. CBG 70 - 120: 0 units  CBG 121 - 150: 2 units  CBG 151 - 200: 3 units  CBG 201 - 250: 5 units  CBG 251 - 300: 8 units  CBG 301 - 350: 11 units  CBG 351 -  400: 15 units  CBG > 400 call MD (Patient not taking: Reported on 12/12/2018) 10 mL 0   insulin aspart (NOVOLOG) 100 unit/mL injection Inject 8 Units into the skin 3 (three) times daily before meals. (Patient not taking: Reported on 12/12/2018) 1 vial 12   insulin aspart (NOVOLOG) 100 UNIT/ML injection Inject 0-5 Units into the skin at bedtime. CBG 70 - 120: 0 units  CBG 121 - 150: 0 units  CBG 151 - 200: 0 units  CBG 201 - 250: 2 units  CBG 251 - 300: 3 units  CBG 301 - 350: 4 units  CBG 351 - 400: 5 units  CBG > 400 call MD (Patient not taking: Reported on 12/12/2018) 10 mL 0   insulin glargine (LANTUS) 100 UNIT/ML injection Inject 0.35 mLs (35 Units total) into the skin daily. (Patient not taking: Reported on 12/12/2018) 10 mL 11   Ipratropium-Albuterol (COMBIVENT) 20-100 MCG/ACT AERS respimat Inhale 1 puff into the lungs 4 (four) times daily as needed. (Patient not taking: Reported on 10/05/2022)     nicotine (NICODERM CQ - DOSED IN MG/24 HOURS) 14 mg/24hr patch Place 1 patch (14 mg total) onto the skin daily. (Patient not taking: Reported on 12/12/2018) 28 patch 0   omeprazole (PRILOSEC) 20 MG capsule Take 20 mg by mouth 2 (two) times daily.  1   propranolol (INDERAL) 40 MG tablet Take 40 mg by mouth 2 (two) times daily.      pyridOXINE (VITAMIN B6) 25 MG tablet Take 25 mg by mouth daily.     Vitamin D, Ergocalciferol, (DRISDOL) 1.25 MG (50000 UNIT) CAPS capsule Take 50,000 Units by mouth once a week.     No current facility-administered medications for this visit.   Facility-Administered Medications Ordered in Other Visits  Medication Dose Route Frequency Provider Last Rate Last Admin   0.9 %  sodium chloride infusion (Manually program via Guardrails IV Fluids)  250 mL Intravenous Continuous Jeralyn Ruths, MD   Stopped at 04/09/23 1316    OBJECTIVE: There were no vitals filed for this visit.    There is no height or weight on file to calculate BMI.    ECOG FS:0 - Asymptomatic  General:  Well-developed, well-nourished, no acute distress. Eyes: Pink conjunctiva, anicteric sclera. HEENT: Normocephalic, moist mucous membranes. Lungs: No audible wheezing or coughing. Heart: Regular rate and rhythm. Abdomen: Soft, nontender, no obvious distention. Musculoskeletal: No edema, cyanosis, or clubbing. Neuro: Alert, answering all questions appropriately. Cranial nerves grossly intact. Skin: No rashes or petechiae noted. Psych: Normal affect.  LAB RESULTS:  Lab Results  Component Value Date   NA 137 03/09/2022   K 3.6 03/09/2022   CL 109 03/09/2022   CO2 20 (L) 03/09/2022  GLUCOSE 128 (H) 03/09/2022   BUN 11 03/09/2022   CREATININE 1.04 03/09/2022   CALCIUM 8.5 (L) 03/09/2022   PROT 7.1 03/09/2022   ALBUMIN 3.4 (L) 03/09/2022   AST 31 03/09/2022   ALT 15 03/09/2022   ALKPHOS 64 03/09/2022   BILITOT 0.8 03/09/2022   GFRNONAA >60 03/09/2022   GFRAA >60 12/13/2018    Lab Results  Component Value Date   WBC 4.4 04/08/2023   NEUTROABS 2.2 04/08/2023   HGB 4.7 (LL) 04/08/2023   HCT 19.8 (L) 04/08/2023   MCV 53.2 (L) 04/08/2023   PLT 94 (L) 04/08/2023   Lab Results  Component Value Date   IRON 23 (L) 11/10/2022   TIBC 463 (H) 11/10/2022   IRONPCTSAT 5 (L) 11/10/2022   Lab Results  Component Value Date   FERRITIN 2 (L) 11/10/2022     STUDIES: No results found.  ASSESSMENT: Iron deficiency anemia.  PLAN:    Iron deficiency anemia: Possibly secondary to GI blood loss.  Patient reports colonoscopy, EGD, and video capsule endoscopy at [facility] did not reveal distinct source, but previously recommended recontacting them for further evaluation.  Patient's hemoglobin is significantly decreased at 4.7 and he will benefit from 2 units of packed red blood cells.  Patient is now retired, so can return to clinic more frequently.  Return to clinic in 3 weeks with repeat laboratory work, further evaluation, and additional transfusion.    I spent a total of 30 minutes  reviewing chart data, face-to-face evaluation with the patient, counseling and coordination of care as detailed above.   Patient expressed understanding and was in agreement with this plan. He also understands that He can call clinic at any time with any questions, concerns, or complaints.    Jeralyn Ruths, MD   04/09/2023 4:31 PM

## 2023-04-10 LAB — BPAM RBC
Blood Product Expiration Date: 202411282359
Blood Product Expiration Date: 202411282359
ISSUE DATE / TIME: 202411010934
ISSUE DATE / TIME: 202411011124
Unit Type and Rh: 5100
Unit Type and Rh: 5100

## 2023-04-10 LAB — TYPE AND SCREEN
ABO/RH(D): O POS
Antibody Screen: NEGATIVE
Unit division: 0
Unit division: 0

## 2023-04-15 ENCOUNTER — Ambulatory Visit: Payer: BC Managed Care – PPO | Admitting: Oncology

## 2023-04-15 ENCOUNTER — Inpatient Hospital Stay: Payer: Medicare Other

## 2023-04-16 ENCOUNTER — Ambulatory Visit: Payer: BC Managed Care – PPO

## 2023-04-23 ENCOUNTER — Encounter: Payer: Self-pay | Admitting: Oncology

## 2023-04-29 ENCOUNTER — Inpatient Hospital Stay: Payer: Medicare Other

## 2023-04-29 ENCOUNTER — Inpatient Hospital Stay (HOSPITAL_BASED_OUTPATIENT_CLINIC_OR_DEPARTMENT_OTHER): Payer: Medicare Other | Admitting: Oncology

## 2023-04-29 ENCOUNTER — Encounter: Payer: Self-pay | Admitting: Oncology

## 2023-04-29 VITALS — BP 177/80 | HR 66 | Temp 97.0°F | Ht 73.0 in | Wt 244.9 lb

## 2023-04-29 DIAGNOSIS — D509 Iron deficiency anemia, unspecified: Secondary | ICD-10-CM

## 2023-04-29 LAB — CBC WITH DIFFERENTIAL/PLATELET
Abs Immature Granulocytes: 0.02 10*3/uL (ref 0.00–0.07)
Basophils Absolute: 0.1 10*3/uL (ref 0.0–0.1)
Basophils Relative: 2 %
Eosinophils Absolute: 0.3 10*3/uL (ref 0.0–0.5)
Eosinophils Relative: 5 %
HCT: 27.5 % — ABNORMAL LOW (ref 39.0–52.0)
Hemoglobin: 6.8 g/dL — CL (ref 13.0–17.0)
Immature Granulocytes: 0 %
Lymphocytes Relative: 24 %
Lymphs Abs: 1.4 10*3/uL (ref 0.7–4.0)
MCH: 14.5 pg — ABNORMAL LOW (ref 26.0–34.0)
MCHC: 24.7 g/dL — ABNORMAL LOW (ref 30.0–36.0)
MCV: 58.5 fL — ABNORMAL LOW (ref 80.0–100.0)
Monocytes Absolute: 0.5 10*3/uL (ref 0.1–1.0)
Monocytes Relative: 9 %
Neutro Abs: 3.4 10*3/uL (ref 1.7–7.7)
Neutrophils Relative %: 60 %
Platelets: 122 10*3/uL — ABNORMAL LOW (ref 150–400)
RBC: 4.7 MIL/uL (ref 4.22–5.81)
RDW: 28.3 % — ABNORMAL HIGH (ref 11.5–15.5)
Smear Review: DECREASED
WBC: 5.7 10*3/uL (ref 4.0–10.5)
nRBC: 0 % (ref 0.0–0.2)

## 2023-04-29 LAB — PREPARE RBC (CROSSMATCH)

## 2023-04-29 NOTE — Progress Notes (Signed)
[facility]  Telephone:(336) [phone] Fax:([phone]  ID: Raymond Erickson OB: 1956/05/05  MR#: [id number]  KVQ#:[id number]  Patient Care Team: Gracelyn Nurse, MD as PCP - General (Internal Medicine) Jeralyn Ruths, MD as Consulting Physician (Oncology)  CHIEF COMPLAINT: Iron deficiency anemia.  INTERVAL HISTORY: Patient returns to clinic today for repeat laboratory work, further evaluation, and consideration of additional blood.  He does not complain of any weakness or fatigue today. He has no neurologic complaints.  He denies any recent fevers or illnesses.  He has a good appetite and denies weight loss.  He has no chest pain, shortness of breath, cough, or hemoptysis.  He denies any nausea, vomiting, constipation, or diarrhea.  He has no melena or hematochezia.  He has no urinary complaints.  Patient offers no specific complaints today.  REVIEW OF SYSTEMS:   Review of Systems  Constitutional: Negative.  Negative for fever, malaise/fatigue and weight loss.  Respiratory: Negative.  Negative for cough, hemoptysis and shortness of breath.   Cardiovascular: Negative.  Negative for chest pain and leg swelling.  Gastrointestinal: Negative.  Negative for abdominal pain, blood in stool and melena.  Genitourinary: Negative.  Negative for dysuria and hematuria.  Musculoskeletal: Negative.   Skin: Negative.  Negative for rash.  Neurological: Negative.  Negative for dizziness, focal weakness, weakness and headaches.  Psychiatric/Behavioral: Negative.  The patient is not nervous/anxious.     As per HPI. Otherwise, a complete review of systems is negative.  PAST MEDICAL HISTORY: Past Medical History:  Diagnosis Date   Alcohol abuse    Arthritis    Cirrhosis of liver (HCC)    COPD with asthma (HCC)    Essential tremor    Gout    Gout    Hematuria    HLD (hyperlipidemia)    HTN (hypertension)     PAST SURGICAL HISTORY: Past Surgical History:  Procedure  Laterality Date   broken nose     HERNIA REPAIR      FAMILY HISTORY: Family History  Problem Relation Age of Onset   Cirrhosis Brother     ADVANCED DIRECTIVES (Y/N):  N  HEALTH MAINTENANCE: Social History   Tobacco Use   Smoking status: Every Day   Smokeless tobacco: Never  Vaping Use   Vaping status: Never Used  Substance Use Topics   Alcohol use: Yes    Alcohol/week: 100.0 standard drinks of alcohol    Types: 100 Standard drinks or equivalent per week   Drug use: No     Colonoscopy:  PAP:  Bone density:  Lipid panel:  Allergies  Allergen Reactions   Ace Inhibitors Swelling   Lisinopril Swelling   Spironolactone Other (See Comments)    gynecomastia    Current Outpatient Medications  Medication Sig Dispense Refill   allopurinol (ZYLOPRIM) 100 MG tablet Take 100 mg by mouth 2 (two) times daily.      amLODipine (NORVASC) 5 MG tablet Take 0.5 tablets (2.5 mg total) by mouth daily.     blood glucose meter kit and supplies KIT Dispense based on patient and insurance preference. Use up to four times daily as directed. (FOR ICD-9 250.00, 250.01). 1 each 0   clonazePAM (KLONOPIN) 0.5 MG tablet Take 0.5 mg by mouth 2 (two) times daily as needed for anxiety.      CONSTULOSE 10 GM/15ML solution Take 30 mLs by mouth 3 (three) times daily.     folic acid (FOLVITE) 1 MG tablet Take 1 mg by mouth  daily.  0   omeprazole (PRILOSEC) 20 MG capsule Take 20 mg by mouth 2 (two) times daily.  1   propranolol (INDERAL) 40 MG tablet Take 40 mg by mouth 2 (two) times daily.      Vitamin D, Ergocalciferol, (DRISDOL) 1.25 MG (50000 UNIT) CAPS capsule Take 50,000 Units by mouth once a week.     gabapentin (NEURONTIN) 100 MG capsule Take 100 mg by mouth 3 (three) times daily. (Patient not taking: Reported on 11/10/2022)     glipiZIDE (GLUCOTROL) 5 MG tablet Take 0.5 tablets (2.5 mg total) by mouth daily before breakfast for 14 days. (Patient not taking: Reported on 10/05/2022) 7 tablet 0    insulin aspart (NOVOLOG) 100 UNIT/ML injection Inject 0-15 Units into the skin 3 (three) times daily with meals. CBG 70 - 120: 0 units  CBG 121 - 150: 2 units  CBG 151 - 200: 3 units  CBG 201 - 250: 5 units  CBG 251 - 300: 8 units  CBG 301 - 350: 11 units  CBG 351 - 400: 15 units  CBG > 400 call MD (Patient not taking: Reported on 12/12/2018) 10 mL 0   insulin aspart (NOVOLOG) 100 unit/mL injection Inject 8 Units into the skin 3 (three) times daily before meals. (Patient not taking: Reported on 12/12/2018) 1 vial 12   insulin aspart (NOVOLOG) 100 UNIT/ML injection Inject 0-5 Units into the skin at bedtime. CBG 70 - 120: 0 units  CBG 121 - 150: 0 units  CBG 151 - 200: 0 units  CBG 201 - 250: 2 units  CBG 251 - 300: 3 units  CBG 301 - 350: 4 units  CBG 351 - 400: 5 units  CBG > 400 call MD (Patient not taking: Reported on 12/12/2018) 10 mL 0   insulin glargine (LANTUS) 100 UNIT/ML injection Inject 0.35 mLs (35 Units total) into the skin daily. (Patient not taking: Reported on 12/12/2018) 10 mL 11   Ipratropium-Albuterol (COMBIVENT) 20-100 MCG/ACT AERS respimat Inhale 1 puff into the lungs 4 (four) times daily as needed. (Patient not taking: Reported on 10/05/2022)     nicotine (NICODERM CQ - DOSED IN MG/24 HOURS) 14 mg/24hr patch Place 1 patch (14 mg total) onto the skin daily. (Patient not taking: Reported on 12/12/2018) 28 patch 0   pyridOXINE (VITAMIN B6) 25 MG tablet Take 25 mg by mouth daily. (Patient not taking: Reported on 04/29/2023)     No current facility-administered medications for this visit.    OBJECTIVE: Vitals:   04/29/23 0937  BP: (!) 177/80  Pulse: 66  Temp: (!) 97 F (36.1 C)  SpO2: 100%      Body mass index is 32.31 kg/m.    ECOG FS:0 - Asymptomatic  General: Well-developed, well-nourished, no acute distress. Eyes: Pink conjunctiva, anicteric sclera. HEENT: Normocephalic, moist mucous membranes. Lungs: No audible wheezing or coughing. Heart: Regular rate and  rhythm. Abdomen: Soft, nontender, no obvious distention. Musculoskeletal: No edema, cyanosis, or clubbing. Neuro: Alert, answering all questions appropriately. Cranial nerves grossly intact. Skin: No rashes or petechiae noted. Psych: Normal affect.  LAB RESULTS:  Lab Results  Component Value Date   NA 137 03/09/2022   K 3.6 03/09/2022   CL 109 03/09/2022   CO2 20 (L) 03/09/2022   GLUCOSE 128 (H) 03/09/2022   BUN 11 03/09/2022   CREATININE 1.04 03/09/2022   CALCIUM 8.5 (L) 03/09/2022   PROT 7.1 03/09/2022   ALBUMIN 3.4 (L) 03/09/2022   AST 31 03/09/2022  ALT 15 03/09/2022   ALKPHOS 64 03/09/2022   BILITOT 0.8 03/09/2022   GFRNONAA >60 03/09/2022   GFRAA >60 12/13/2018    Lab Results  Component Value Date   WBC 5.7 04/29/2023   NEUTROABS 3.4 04/29/2023   HGB 6.8 (LL) 04/29/2023   HCT 27.5 (L) 04/29/2023   MCV 58.5 (L) 04/29/2023   PLT 122 (L) 04/29/2023   Lab Results  Component Value Date   IRON 23 (L) 11/10/2022   TIBC 463 (H) 11/10/2022   IRONPCTSAT 5 (L) 11/10/2022   Lab Results  Component Value Date   FERRITIN 2 (L) 11/10/2022     STUDIES: No results found.  ASSESSMENT: Iron deficiency anemia.  PLAN:    Iron deficiency anemia: Possibly secondary to GI blood loss.  Patient reports colonoscopy, EGD, and video capsule endoscopy at [facility] did not reveal distinct source.  Recommended he recontact [facility] for repeat luminal evaluation, but patient states he will further discuss this with his primary care physician.  His hemoglobin remains reduced, but improved from previous at 6.8.  Return to clinic tomorrow for 2 units packed red blood cells.  Patient is now retired, so can return to clinic more frequently.  Return to clinic in 4 weeks with repeat laboratory, further evaluation, and consideration of additional blood if needed. Hypertension: Patient's blood pressure is moderately elevated today.  Continue monitoring and treatment per primary care.  I  spent a total of 30 minutes reviewing chart data, face-to-face evaluation with the patient, counseling and coordination of care as detailed above.    Patient expressed understanding and was in agreement with this plan. He also understands that He can call clinic at any time with any questions, concerns, or complaints.    Jeralyn Ruths, MD   04/29/2023 11:31 AM

## 2023-04-30 ENCOUNTER — Inpatient Hospital Stay: Payer: Medicare Other

## 2023-04-30 DIAGNOSIS — D509 Iron deficiency anemia, unspecified: Secondary | ICD-10-CM

## 2023-04-30 MED ORDER — SODIUM CHLORIDE 0.9% IV SOLUTION
250.0000 mL | INTRAVENOUS | Status: DC
  Administered 2023-04-30: 100 mL via INTRAVENOUS
  Filled 2023-04-30: qty 250

## 2023-04-30 MED ORDER — DIPHENHYDRAMINE HCL 50 MG/ML IJ SOLN
25.0000 mg | Freq: Once | INTRAMUSCULAR | Status: AC
Start: 2023-04-30 — End: 2023-04-30
  Administered 2023-04-30: 25 mg via INTRAVENOUS
  Filled 2023-04-30: qty 1

## 2023-04-30 MED ORDER — ACETAMINOPHEN 325 MG PO TABS
650.0000 mg | ORAL_TABLET | Freq: Once | ORAL | Status: AC
  Administered 2023-04-30: 650 mg via ORAL
  Filled 2023-04-30: qty 2

## 2023-05-01 LAB — BPAM RBC
Blood Product Expiration Date: 202412172359
Blood Product Expiration Date: 202412172359
ISSUE DATE / TIME: 202411221144
ISSUE DATE / TIME: 202411220934
Unit Type and Rh: 5100
Unit Type and Rh: 5100

## 2023-05-01 LAB — TYPE AND SCREEN
ABO/RH(D): O POS
Antibody Screen: NEGATIVE
Unit division: 0
Unit division: 0

## 2023-05-23 ENCOUNTER — Encounter: Payer: Self-pay | Admitting: Oncology

## 2023-05-26 ENCOUNTER — Other Ambulatory Visit: Payer: Self-pay

## 2023-05-26 DIAGNOSIS — D509 Iron deficiency anemia, unspecified: Secondary | ICD-10-CM

## 2023-05-27 ENCOUNTER — Inpatient Hospital Stay: Payer: Medicare Other | Attending: Oncology

## 2023-05-27 ENCOUNTER — Encounter: Payer: Self-pay | Admitting: Oncology

## 2023-05-27 ENCOUNTER — Inpatient Hospital Stay (HOSPITAL_BASED_OUTPATIENT_CLINIC_OR_DEPARTMENT_OTHER): Payer: Medicare Other | Admitting: Oncology

## 2023-05-27 VITALS — BP 179/76 | HR 66 | Temp 97.5°F | Resp 16 | Ht 73.0 in | Wt 245.0 lb

## 2023-05-27 DIAGNOSIS — D509 Iron deficiency anemia, unspecified: Secondary | ICD-10-CM

## 2023-05-27 DIAGNOSIS — F17210 Nicotine dependence, cigarettes, uncomplicated: Secondary | ICD-10-CM | POA: Insufficient documentation

## 2023-05-27 DIAGNOSIS — I10 Essential (primary) hypertension: Secondary | ICD-10-CM | POA: Insufficient documentation

## 2023-05-27 LAB — CBC WITH DIFFERENTIAL (CANCER CENTER ONLY)
Abs Immature Granulocytes: 0.02 10*3/uL (ref 0.00–0.07)
Basophils Absolute: 0.1 10*3/uL (ref 0.0–0.1)
Basophils Relative: 1 %
Eosinophils Absolute: 0.4 10*3/uL (ref 0.0–0.5)
Eosinophils Relative: 7 %
HCT: 29.9 % — ABNORMAL LOW (ref 39.0–52.0)
Hemoglobin: 7.9 g/dL — ABNORMAL LOW (ref 13.0–17.0)
Immature Granulocytes: 0 %
Lymphocytes Relative: 27 %
Lymphs Abs: 1.6 10*3/uL (ref 0.7–4.0)
MCH: 16.8 pg — ABNORMAL LOW (ref 26.0–34.0)
MCHC: 26.4 g/dL — ABNORMAL LOW (ref 30.0–36.0)
MCV: 63.6 fL — ABNORMAL LOW (ref 80.0–100.0)
Monocytes Absolute: 0.4 10*3/uL (ref 0.1–1.0)
Monocytes Relative: 7 %
Neutro Abs: 3.3 10*3/uL (ref 1.7–7.7)
Neutrophils Relative %: 58 %
Platelet Count: 112 10*3/uL — ABNORMAL LOW (ref 150–400)
RBC: 4.7 MIL/uL (ref 4.22–5.81)
RDW: 26.6 % — ABNORMAL HIGH (ref 11.5–15.5)
Smear Review: NORMAL
WBC Count: 5.8 10*3/uL (ref 4.0–10.5)
nRBC: 0 % (ref 0.0–0.2)

## 2023-05-27 LAB — PREPARE RBC (CROSSMATCH)

## 2023-05-27 NOTE — Progress Notes (Signed)
[facility]  Telephone:(336) [phone] Fax:(336) [phone]  ID: Raymond Erickson OB: 17-May-1956  MR#: [id number]  MWU#:[id number]  Patient Care Team: Gracelyn Nurse, MD as PCP - General (Internal Medicine) Jeralyn Ruths, MD as Consulting Physician (Oncology)  CHIEF COMPLAINT: Iron deficiency anemia.  INTERVAL HISTORY: Patient returns to clinic today for repeat laboratory work, further evaluation, and consideration of additional blood.  He has weakness and fatigue, but otherwise feels well.  He has no neurologic complaints.  He denies any recent fevers or illnesses.  He has a good appetite and denies weight loss.  He has no chest pain, shortness of breath, cough, or hemoptysis.  He denies any nausea, vomiting, constipation, or diarrhea.  He has no melena or hematochezia.  He has no urinary complaints.  Patient offers no further specific complaints today.  REVIEW OF SYSTEMS:   Review of Systems  Constitutional:  Positive for malaise/fatigue. Negative for fever and weight loss.  Respiratory: Negative.  Negative for cough, hemoptysis and shortness of breath.   Cardiovascular: Negative.  Negative for chest pain and leg swelling.  Gastrointestinal: Negative.  Negative for abdominal pain, blood in stool and melena.  Genitourinary: Negative.  Negative for dysuria and hematuria.  Musculoskeletal: Negative.   Skin: Negative.  Negative for rash.  Neurological:  Positive for weakness. Negative for dizziness, focal weakness and headaches.  Psychiatric/Behavioral: Negative.  The patient is not nervous/anxious.     As per HPI. Otherwise, a complete review of systems is negative.  PAST MEDICAL HISTORY: Past Medical History:  Diagnosis Date   Alcohol abuse    Arthritis    Cirrhosis of liver (HCC)    COPD with asthma (HCC)    Essential tremor    Gout    Gout    Hematuria    HLD (hyperlipidemia)    HTN (hypertension)     PAST SURGICAL HISTORY: Past Surgical  History:  Procedure Laterality Date   broken nose     HERNIA REPAIR      FAMILY HISTORY: Family History  Problem Relation Age of Onset   Cirrhosis Brother     ADVANCED DIRECTIVES (Y/N):  N  HEALTH MAINTENANCE: Social History   Tobacco Use   Smoking status: Every Day   Smokeless tobacco: Never  Vaping Use   Vaping status: Never Used  Substance Use Topics   Alcohol use: Yes    Alcohol/week: 100.0 standard drinks of alcohol    Types: 100 Standard drinks or equivalent per week   Drug use: No     Colonoscopy:  PAP:  Bone density:  Lipid panel:  Allergies  Allergen Reactions   Ace Inhibitors Swelling   Lisinopril Swelling   Spironolactone Other (See Comments)    gynecomastia    Current Outpatient Medications  Medication Sig Dispense Refill   allopurinol (ZYLOPRIM) 100 MG tablet Take 100 mg by mouth 2 (two) times daily.      amLODipine (NORVASC) 5 MG tablet Take 0.5 tablets (2.5 mg total) by mouth daily.     blood glucose meter kit and supplies KIT Dispense based on patient and insurance preference. Use up to four times daily as directed. (FOR ICD-9 250.00, 250.01). 1 each 0   clonazePAM (KLONOPIN) 0.5 MG tablet Take 0.5 mg by mouth 2 (two) times daily as needed for anxiety.      CONSTULOSE 10 GM/15ML solution Take 30 mLs by mouth 3 (three) times daily.     folic acid (FOLVITE) 1 MG tablet Take 1  mg by mouth daily.  0   insulin aspart (NOVOLOG) 100 UNIT/ML injection Inject 0-15 Units into the skin 3 (three) times daily with meals. CBG 70 - 120: 0 units  CBG 121 - 150: 2 units  CBG 151 - 200: 3 units  CBG 201 - 250: 5 units  CBG 251 - 300: 8 units  CBG 301 - 350: 11 units  CBG 351 - 400: 15 units  CBG > 400 call MD 10 mL 0   insulin aspart (NOVOLOG) 100 unit/mL injection Inject 8 Units into the skin 3 (three) times daily before meals. 1 vial 12   insulin aspart (NOVOLOG) 100 UNIT/ML injection Inject 0-5 Units into the skin at bedtime. CBG 70 - 120: 0 units  CBG 121  - 150: 0 units  CBG 151 - 200: 0 units  CBG 201 - 250: 2 units  CBG 251 - 300: 3 units  CBG 301 - 350: 4 units  CBG 351 - 400: 5 units  CBG > 400 call MD 10 mL 0   insulin glargine (LANTUS) 100 UNIT/ML injection Inject 0.35 mLs (35 Units total) into the skin daily. 10 mL 11   Ipratropium-Albuterol (COMBIVENT) 20-100 MCG/ACT AERS respimat Inhale 1 puff into the lungs 4 (four) times daily as needed.     nicotine (NICODERM CQ - DOSED IN MG/24 HOURS) 14 mg/24hr patch Place 1 patch (14 mg total) onto the skin daily. 28 patch 0   omeprazole (PRILOSEC) 20 MG capsule Take 20 mg by mouth 2 (two) times daily.  1   propranolol (INDERAL) 40 MG tablet Take 40 mg by mouth 2 (two) times daily.      pyridOXINE (VITAMIN B6) 25 MG tablet Take 25 mg by mouth daily.     Vitamin D, Ergocalciferol, (DRISDOL) 1.25 MG (50000 UNIT) CAPS capsule Take 50,000 Units by mouth once a week.     gabapentin (NEURONTIN) 100 MG capsule Take 100 mg by mouth 3 (three) times daily. (Patient not taking: Reported on 11/10/2022)     glipiZIDE (GLUCOTROL) 5 MG tablet Take 0.5 tablets (2.5 mg total) by mouth daily before breakfast for 14 days. (Patient not taking: Reported on 05/27/2023) 7 tablet 0   No current facility-administered medications for this visit.    OBJECTIVE: Vitals:   05/27/23 1031  BP: (!) 179/76  Pulse: 66  Resp: 16  Temp: (!) 97.5 F (36.4 C)  SpO2: 100%      Body mass index is 32.32 kg/m.    ECOG FS:0 - Asymptomatic  General: Well-developed, well-nourished, no acute distress. Eyes: Pink conjunctiva, anicteric sclera. HEENT: Normocephalic, moist mucous membranes. Lungs: No audible wheezing or coughing. Heart: Regular rate and rhythm. Abdomen: Soft, nontender, no obvious distention. Musculoskeletal: No edema, cyanosis, or clubbing. Neuro: Alert, answering all questions appropriately. Cranial nerves grossly intact. Skin: No rashes or petechiae noted. Psych: Normal affect.  LAB RESULTS:  Lab Results   Component Value Date   NA 137 03/09/2022   K 3.6 03/09/2022   CL 109 03/09/2022   CO2 20 (L) 03/09/2022   GLUCOSE 128 (H) 03/09/2022   BUN 11 03/09/2022   CREATININE 1.04 03/09/2022   CALCIUM 8.5 (L) 03/09/2022   PROT 7.1 03/09/2022   ALBUMIN 3.4 (L) 03/09/2022   AST 31 03/09/2022   ALT 15 03/09/2022   ALKPHOS 64 03/09/2022   BILITOT 0.8 03/09/2022   GFRNONAA >60 03/09/2022   GFRAA >60 12/13/2018    Lab Results  Component Value Date   WBC  5.8 05/27/2023   NEUTROABS 3.3 05/27/2023   HGB 7.9 (L) 05/27/2023   HCT 29.9 (L) 05/27/2023   MCV 63.6 (L) 05/27/2023   PLT 112 (L) 05/27/2023   Lab Results  Component Value Date   IRON 23 (L) 11/10/2022   TIBC 463 (H) 11/10/2022   IRONPCTSAT 5 (L) 11/10/2022   Lab Results  Component Value Date   FERRITIN 2 (L) 11/10/2022     STUDIES: No results found.  ASSESSMENT: Iron deficiency anemia.  PLAN:    Iron deficiency anemia: Possibly secondary to GI blood loss.  Patient reports colonoscopy, EGD, and video capsule endoscopy at [facility] did not reveal distinct source.  Previously recommended he recontact [facility] for repeat luminal evaluation, but patient states he will further discuss this with his primary care physician.  Hemoglobin continues to improve to 7.9, but he remains symptomatic.  Return to clinic tomorrow for 1 unit of packed red blood cells. Patient is now retired, so can return to clinic more frequently.  Return to clinic in approximately 6 weeks with repeat laboratory, further evaluation, consideration of additional blood if needed.   Hypertension: Chronic and unchanged.  Patient's blood pressure remains significantly elevated.  Continue monitoring and treatment per primary care.  I spent a total of 30 minutes reviewing chart data, face-to-face evaluation with the patient, counseling and coordination of care as detailed above.   Patient expressed understanding and was in agreement with this plan. He also  understands that He can call clinic at any time with any questions, concerns, or complaints.    Jeralyn Ruths, MD   05/27/2023 1:46 PM

## 2023-05-28 ENCOUNTER — Inpatient Hospital Stay: Payer: Medicare Other

## 2023-05-28 DIAGNOSIS — D509 Iron deficiency anemia, unspecified: Secondary | ICD-10-CM

## 2023-05-28 MED ORDER — DIPHENHYDRAMINE HCL 50 MG/ML IJ SOLN
25.0000 mg | Freq: Once | INTRAMUSCULAR | Status: AC
  Administered 2023-05-28: 25 mg via INTRAVENOUS
  Filled 2023-05-28: qty 1

## 2023-05-28 MED ORDER — SODIUM CHLORIDE 0.9% IV SOLUTION
250.0000 mL | INTRAVENOUS | Status: DC
  Administered 2023-05-28: 250 mL via INTRAVENOUS
  Filled 2023-05-28: qty 250

## 2023-05-28 MED ORDER — ACETAMINOPHEN 325 MG PO TABS
650.0000 mg | ORAL_TABLET | Freq: Once | ORAL | Status: AC
  Administered 2023-05-28: 650 mg via ORAL
  Filled 2023-05-28: qty 2

## 2023-05-29 LAB — TYPE AND SCREEN
ABO/RH(D): O POS
Antibody Screen: NEGATIVE
Unit division: 0

## 2023-05-29 LAB — BPAM RBC
Blood Product Expiration Date: 202501082359
ISSUE DATE / TIME: 202412200905
Unit Type and Rh: 5100

## 2023-07-09 ENCOUNTER — Other Ambulatory Visit: Payer: Self-pay | Admitting: *Deleted

## 2023-07-09 DIAGNOSIS — D509 Iron deficiency anemia, unspecified: Secondary | ICD-10-CM

## 2023-07-12 ENCOUNTER — Other Ambulatory Visit: Payer: Self-pay | Admitting: *Deleted

## 2023-07-12 ENCOUNTER — Inpatient Hospital Stay: Payer: Medicare Other | Admitting: Oncology

## 2023-07-12 ENCOUNTER — Inpatient Hospital Stay: Payer: Medicare Other | Attending: Oncology

## 2023-07-12 ENCOUNTER — Encounter: Payer: Self-pay | Admitting: Oncology

## 2023-07-12 VITALS — BP 179/76 | HR 56 | Temp 97.0°F | Resp 18 | Ht 73.0 in | Wt 253.0 lb

## 2023-07-12 DIAGNOSIS — J449 Chronic obstructive pulmonary disease, unspecified: Secondary | ICD-10-CM | POA: Insufficient documentation

## 2023-07-12 DIAGNOSIS — D509 Iron deficiency anemia, unspecified: Secondary | ICD-10-CM

## 2023-07-12 DIAGNOSIS — Z7984 Long term (current) use of oral hypoglycemic drugs: Secondary | ICD-10-CM | POA: Insufficient documentation

## 2023-07-12 DIAGNOSIS — R1010 Upper abdominal pain, unspecified: Secondary | ICD-10-CM | POA: Diagnosis not present

## 2023-07-12 DIAGNOSIS — M129 Arthropathy, unspecified: Secondary | ICD-10-CM | POA: Diagnosis not present

## 2023-07-12 DIAGNOSIS — G250 Essential tremor: Secondary | ICD-10-CM | POA: Diagnosis not present

## 2023-07-12 DIAGNOSIS — E785 Hyperlipidemia, unspecified: Secondary | ICD-10-CM | POA: Diagnosis not present

## 2023-07-12 DIAGNOSIS — Z79899 Other long term (current) drug therapy: Secondary | ICD-10-CM | POA: Insufficient documentation

## 2023-07-12 DIAGNOSIS — J4489 Other specified chronic obstructive pulmonary disease: Secondary | ICD-10-CM | POA: Diagnosis not present

## 2023-07-12 DIAGNOSIS — I10 Essential (primary) hypertension: Secondary | ICD-10-CM | POA: Diagnosis not present

## 2023-07-12 DIAGNOSIS — F17210 Nicotine dependence, cigarettes, uncomplicated: Secondary | ICD-10-CM | POA: Diagnosis not present

## 2023-07-12 DIAGNOSIS — M109 Gout, unspecified: Secondary | ICD-10-CM | POA: Diagnosis not present

## 2023-07-12 DIAGNOSIS — R531 Weakness: Secondary | ICD-10-CM | POA: Diagnosis not present

## 2023-07-12 DIAGNOSIS — D696 Thrombocytopenia, unspecified: Secondary | ICD-10-CM | POA: Diagnosis not present

## 2023-07-12 LAB — CBC WITH DIFFERENTIAL/PLATELET
Abs Immature Granulocytes: 0.01 10*3/uL (ref 0.00–0.07)
Basophils Absolute: 0.1 10*3/uL (ref 0.0–0.1)
Basophils Relative: 1 %
Eosinophils Absolute: 0.4 10*3/uL (ref 0.0–0.5)
Eosinophils Relative: 7 %
HCT: 24.6 % — ABNORMAL LOW (ref 39.0–52.0)
Hemoglobin: 6.6 g/dL — CL (ref 13.0–17.0)
Immature Granulocytes: 0 %
Lymphocytes Relative: 26 %
Lymphs Abs: 1.3 10*3/uL (ref 0.7–4.0)
MCH: 16.4 pg — ABNORMAL LOW (ref 26.0–34.0)
MCHC: 26.8 g/dL — ABNORMAL LOW (ref 30.0–36.0)
MCV: 61.2 fL — ABNORMAL LOW (ref 80.0–100.0)
Monocytes Absolute: 0.4 10*3/uL (ref 0.1–1.0)
Monocytes Relative: 9 %
Neutro Abs: 2.8 10*3/uL (ref 1.7–7.7)
Neutrophils Relative %: 57 %
Platelets: 65 10*3/uL — ABNORMAL LOW (ref 150–400)
RBC: 4.02 MIL/uL — ABNORMAL LOW (ref 4.22–5.81)
RDW: 21.2 % — ABNORMAL HIGH (ref 11.5–15.5)
WBC: 5 10*3/uL (ref 4.0–10.5)
nRBC: 0 % (ref 0.0–0.2)

## 2023-07-12 LAB — SAMPLE TO BLOOD BANK

## 2023-07-12 LAB — PREPARE RBC (CROSSMATCH)

## 2023-07-12 NOTE — Progress Notes (Signed)
[facility]  Telephone:(336) [phone] Fax:(336) [phone]  ID: Raymond Erickson OB: 1956/01/02  MR#: [id number]  NGE#:[id number]  Patient Care Team: Gracelyn Nurse, MD as PCP - General (Internal Medicine) Jeralyn Ruths, MD as Consulting Physician (Oncology)  CHIEF COMPLAINT: Iron deficiency anemia.  INTERVAL HISTORY: Patient returns to clinic today for repeat laboratory work, further evaluation, and consideration of additional blood.  He has chronic weakness and fatigue, but otherwise feels well.  He has no neurologic complaints.  He denies any recent fevers or illnesses.  He has a good appetite and denies weight loss.  He has no chest pain, shortness of breath, cough, or hemoptysis.  He denies any nausea, vomiting, constipation, or diarrhea.  He has no melena or hematochezia.  He has no urinary complaints.  Patient offers no further specific complaints today.  REVIEW OF SYSTEMS:   Review of Systems  Constitutional:  Positive for malaise/fatigue. Negative for fever and weight loss.  Respiratory: Negative.  Negative for cough, hemoptysis and shortness of breath.   Cardiovascular: Negative.  Negative for chest pain and leg swelling.  Gastrointestinal: Negative.  Negative for abdominal pain, blood in stool and melena.  Genitourinary: Negative.  Negative for dysuria and hematuria.  Musculoskeletal: Negative.   Skin: Negative.  Negative for rash.  Neurological:  Positive for weakness. Negative for dizziness, focal weakness and headaches.  Psychiatric/Behavioral: Negative.  The patient is not nervous/anxious.     As per HPI. Otherwise, a complete review of systems is negative.  PAST MEDICAL HISTORY: Past Medical History:  Diagnosis Date   Alcohol abuse    Arthritis    Cirrhosis of liver (HCC)    COPD with asthma (HCC)    Essential tremor    Gout    Gout    Hematuria    HLD (hyperlipidemia)    HTN (hypertension)     PAST SURGICAL HISTORY: Past  Surgical History:  Procedure Laterality Date   broken nose     HERNIA REPAIR      FAMILY HISTORY: Family History  Problem Relation Age of Onset   Cirrhosis Brother     ADVANCED DIRECTIVES (Y/N):  N  HEALTH MAINTENANCE: Social History   Tobacco Use   Smoking status: Every Day   Smokeless tobacco: Never  Vaping Use   Vaping status: Never Used  Substance Use Topics   Alcohol use: Yes    Alcohol/week: 100.0 standard drinks of alcohol    Types: 100 Standard drinks or equivalent per week   Drug use: No     Colonoscopy:  PAP:  Bone density:  Lipid panel:  Allergies  Allergen Reactions   Ace Inhibitors Swelling   Lisinopril Swelling   Spironolactone Other (See Comments)    gynecomastia    Current Outpatient Medications  Medication Sig Dispense Refill   allopurinol (ZYLOPRIM) 100 MG tablet Take 100 mg by mouth 2 (two) times daily.      amLODipine (NORVASC) 5 MG tablet Take 0.5 tablets (2.5 mg total) by mouth daily.     blood glucose meter kit and supplies KIT Dispense based on patient and insurance preference. Use up to four times daily as directed. (FOR ICD-9 250.00, 250.01). 1 each 0   clonazePAM (KLONOPIN) 0.5 MG tablet Take 0.5 mg by mouth 2 (two) times daily as needed for anxiety.      CONSTULOSE 10 GM/15ML solution Take 30 mLs by mouth 3 (three) times daily.     folic acid (FOLVITE) 1 MG tablet Take  1 mg by mouth daily.  0   insulin aspart (NOVOLOG) 100 UNIT/ML injection Inject 0-15 Units into the skin 3 (three) times daily with meals. CBG 70 - 120: 0 units  CBG 121 - 150: 2 units  CBG 151 - 200: 3 units  CBG 201 - 250: 5 units  CBG 251 - 300: 8 units  CBG 301 - 350: 11 units  CBG 351 - 400: 15 units  CBG > 400 call MD 10 mL 0   insulin aspart (NOVOLOG) 100 unit/mL injection Inject 8 Units into the skin 3 (three) times daily before meals. 1 vial 12   insulin aspart (NOVOLOG) 100 UNIT/ML injection Inject 0-5 Units into the skin at bedtime. CBG 70 - 120: 0  units  CBG 121 - 150: 0 units  CBG 151 - 200: 0 units  CBG 201 - 250: 2 units  CBG 251 - 300: 3 units  CBG 301 - 350: 4 units  CBG 351 - 400: 5 units  CBG > 400 call MD 10 mL 0   insulin glargine (LANTUS) 100 UNIT/ML injection Inject 0.35 mLs (35 Units total) into the skin daily. 10 mL 11   Ipratropium-Albuterol (COMBIVENT) 20-100 MCG/ACT AERS respimat Inhale 1 puff into the lungs 4 (four) times daily as needed.     nicotine (NICODERM CQ - DOSED IN MG/24 HOURS) 14 mg/24hr patch Place 1 patch (14 mg total) onto the skin daily. 28 patch 0   omeprazole (PRILOSEC) 20 MG capsule Take 20 mg by mouth 2 (two) times daily.  1   propranolol (INDERAL) 40 MG tablet Take 40 mg by mouth 2 (two) times daily.      pyridOXINE (VITAMIN B6) 25 MG tablet Take 25 mg by mouth daily.     Vitamin D, Ergocalciferol, (DRISDOL) 1.25 MG (50000 UNIT) CAPS capsule Take 50,000 Units by mouth once a week.     gabapentin (NEURONTIN) 100 MG capsule Take 100 mg by mouth 3 (three) times daily. (Patient not taking: Reported on 11/10/2022)     glipiZIDE (GLUCOTROL) 5 MG tablet Take 0.5 tablets (2.5 mg total) by mouth daily before breakfast for 14 days. (Patient not taking: Reported on 10/05/2022) 7 tablet 0   No current facility-administered medications for this visit.    OBJECTIVE: Vitals:   07/12/23 0932  BP: (!) 179/76  Pulse: (!) 56  Resp: 18  Temp: (!) 97 F (36.1 C)  SpO2: 100%      Body mass index is 33.38 kg/m.    ECOG FS:0 - Asymptomatic  General: Well-developed, well-nourished, no acute distress. Eyes: Pink conjunctiva, anicteric sclera. HEENT: Normocephalic, moist mucous membranes. Lungs: No audible wheezing or coughing. Heart: Regular rate and rhythm. Abdomen: Soft, nontender, no obvious distention. Musculoskeletal: No edema, cyanosis, or clubbing. Neuro: Alert, answering all questions appropriately. Cranial nerves grossly intact. Skin: No rashes or petechiae noted. Psych: Normal affect.  LAB  RESULTS:  Lab Results  Component Value Date   NA 137 03/09/2022   K 3.6 03/09/2022   CL 109 03/09/2022   CO2 20 (L) 03/09/2022   GLUCOSE 128 (H) 03/09/2022   BUN 11 03/09/2022   CREATININE 1.04 03/09/2022   CALCIUM 8.5 (L) 03/09/2022   PROT 7.1 03/09/2022   ALBUMIN 3.4 (L) 03/09/2022   AST 31 03/09/2022   ALT 15 03/09/2022   ALKPHOS 64 03/09/2022   BILITOT 0.8 03/09/2022   GFRNONAA >60 03/09/2022   GFRAA >60 12/13/2018    Lab Results  Component Value Date  WBC 5.0 07/12/2023   NEUTROABS 2.8 07/12/2023   HGB 6.6 (LL) 07/12/2023   HCT 24.6 (L) 07/12/2023   MCV 61.2 (L) 07/12/2023   PLT 65 (L) 07/12/2023   Lab Results  Component Value Date   IRON 23 (L) 11/10/2022   TIBC 463 (H) 11/10/2022   IRONPCTSAT 5 (L) 11/10/2022   Lab Results  Component Value Date   FERRITIN 2 (L) 11/10/2022     STUDIES: No results found.  ASSESSMENT: Iron deficiency anemia.  PLAN:    Iron deficiency anemia: Possibly secondary to GI blood loss, but given patient's thrombocytopenia he may also have underlying bone marrow dysfunction.  Patient reports colonoscopy, EGD, and video capsule endoscopy at [facility] did not reveal distinct source.  Previously recommended he recontact [facility] for repeat luminal evaluation and plans to discuss further with his primary care physician in 1 month.  Hemoglobin is decreased to 6.6, therefore he will return to clinic tomorrow for 2 units of packed red blood cells.  Will get a bone marrow biopsy in the next 1 to 2 weeks and then patient will follow-up 1 week after his biopsy for further evaluation, discussion of the results, and consideration of additional blood if needed.  Thrombocytopenia: Patient's platelet count dropped to 65.  Bone marrow biopsy as above. Hypertension: Chronic and unchanged.  Patient's blood pressure remains significantly elevated.  Continue monitoring and treatment per primary care.  I spent a total of 30 minutes reviewing chart  data, face-to-face evaluation with the patient, counseling and coordination of care as detailed above.   Patient expressed understanding and was in agreement with this plan. He also understands that He can call clinic at any time with any questions, concerns, or complaints.    Jeralyn Ruths, MD   07/12/2023 9:58 AM

## 2023-07-12 NOTE — Addendum Note (Signed)
Addended by: Jeralyn Ruths on: 07/12/2023 10:30 AM   Modules accepted: Orders

## 2023-07-13 ENCOUNTER — Inpatient Hospital Stay: Payer: Medicare Other

## 2023-07-13 DIAGNOSIS — D509 Iron deficiency anemia, unspecified: Secondary | ICD-10-CM

## 2023-07-13 MED ORDER — ACETAMINOPHEN 325 MG PO TABS
650.0000 mg | ORAL_TABLET | Freq: Once | ORAL | Status: AC
  Administered 2023-07-13: 650 mg via ORAL
  Filled 2023-07-13: qty 2

## 2023-07-13 MED ORDER — DIPHENHYDRAMINE HCL 50 MG/ML IJ SOLN
25.0000 mg | Freq: Once | INTRAMUSCULAR | Status: AC
  Administered 2023-07-13: 25 mg via INTRAVENOUS
  Filled 2023-07-13: qty 1

## 2023-07-13 MED ORDER — SODIUM CHLORIDE 0.9% IV SOLUTION
250.0000 mL | INTRAVENOUS | Status: DC
  Administered 2023-07-13: 100 mL via INTRAVENOUS
  Filled 2023-07-13: qty 250

## 2023-07-13 NOTE — Patient Instructions (Signed)

## 2023-07-14 LAB — TYPE AND SCREEN
ABO/RH(D): O POS
Antibody Screen: NEGATIVE
Unit division: 0
Unit division: 0

## 2023-07-14 LAB — BPAM RBC
Blood Product Expiration Date: 202502152359
Blood Product Unit Number: 202502152359
ISSUE DATE / TIME: 202502041026
PRODUCT CODE: 202502040840
PRODUCT CODE: 202502152359
Unit Type and Rh: 5100
Unit Type and Rh: 202502152359
Unit Type and Rh: 5100
Unit Type and Rh: 5100
